# Patient Record
Sex: Female | Born: 1975 | Race: Black or African American | Hispanic: No | Marital: Single | Smoking: Current every day smoker
Health system: Southern US, Community
[De-identification: ages and names within clinical notes are randomized; demographics above are authoritative.]

## PROBLEM LIST (undated history)

## (undated) DIAGNOSIS — F419 Anxiety disorder, unspecified: Secondary | ICD-10-CM

## (undated) DIAGNOSIS — I10 Essential (primary) hypertension: Secondary | ICD-10-CM

## (undated) DIAGNOSIS — F32A Depression, unspecified: Secondary | ICD-10-CM

## (undated) DIAGNOSIS — K219 Gastro-esophageal reflux disease without esophagitis: Secondary | ICD-10-CM

## (undated) DIAGNOSIS — J45909 Unspecified asthma, uncomplicated: Secondary | ICD-10-CM

## (undated) DIAGNOSIS — F329 Major depressive disorder, single episode, unspecified: Secondary | ICD-10-CM

## (undated) HISTORY — PX: RECTAL POLYPECTOMY: SHX2309

## (undated) HISTORY — PX: HEMORRHOID SURGERY: SHX153

---

## 2002-01-10 ENCOUNTER — Other Ambulatory Visit: Admission: RE | Admit: 2002-01-10 | Discharge: 2002-01-10 | Payer: Self-pay | Admitting: Family Medicine

## 2003-04-17 ENCOUNTER — Other Ambulatory Visit: Admission: RE | Admit: 2003-04-17 | Discharge: 2003-04-17 | Payer: Self-pay | Admitting: Family Medicine

## 2004-03-16 ENCOUNTER — Emergency Department (HOSPITAL_COMMUNITY): Admission: EM | Admit: 2004-03-16 | Discharge: 2004-03-17 | Payer: Self-pay | Admitting: Emergency Medicine

## 2005-03-20 ENCOUNTER — Inpatient Hospital Stay (HOSPITAL_COMMUNITY): Admission: AD | Admit: 2005-03-20 | Discharge: 2005-03-20 | Payer: Self-pay | Admitting: Obstetrics

## 2005-05-09 ENCOUNTER — Inpatient Hospital Stay (HOSPITAL_COMMUNITY): Admission: AD | Admit: 2005-05-09 | Discharge: 2005-05-09 | Payer: Self-pay | Admitting: Obstetrics

## 2005-08-15 ENCOUNTER — Ambulatory Visit: Payer: Self-pay | Admitting: *Deleted

## 2005-08-15 ENCOUNTER — Inpatient Hospital Stay (HOSPITAL_COMMUNITY): Admission: AD | Admit: 2005-08-15 | Discharge: 2005-08-15 | Payer: Self-pay | Admitting: Obstetrics

## 2005-08-18 ENCOUNTER — Ambulatory Visit: Payer: Self-pay | Admitting: *Deleted

## 2005-08-21 ENCOUNTER — Inpatient Hospital Stay (HOSPITAL_COMMUNITY): Admission: AD | Admit: 2005-08-21 | Discharge: 2005-08-22 | Payer: Self-pay | Admitting: Obstetrics

## 2005-08-25 ENCOUNTER — Ambulatory Visit: Payer: Self-pay | Admitting: *Deleted

## 2005-08-29 ENCOUNTER — Ambulatory Visit (HOSPITAL_COMMUNITY): Admission: RE | Admit: 2005-08-29 | Discharge: 2005-08-29 | Payer: Self-pay | Admitting: Obstetrics

## 2005-08-29 ENCOUNTER — Ambulatory Visit: Payer: Self-pay | Admitting: *Deleted

## 2005-09-01 ENCOUNTER — Ambulatory Visit: Payer: Self-pay | Admitting: *Deleted

## 2005-09-02 ENCOUNTER — Encounter (INDEPENDENT_AMBULATORY_CARE_PROVIDER_SITE_OTHER): Payer: Self-pay | Admitting: Specialist

## 2005-09-02 ENCOUNTER — Inpatient Hospital Stay (HOSPITAL_COMMUNITY): Admission: AD | Admit: 2005-09-02 | Discharge: 2005-09-07 | Payer: Self-pay | Admitting: Obstetrics

## 2005-09-08 ENCOUNTER — Encounter: Admission: RE | Admit: 2005-09-08 | Discharge: 2005-10-05 | Payer: Self-pay | Admitting: Obstetrics

## 2005-09-09 ENCOUNTER — Observation Stay (HOSPITAL_COMMUNITY): Admission: AD | Admit: 2005-09-09 | Discharge: 2005-09-10 | Payer: Self-pay | Admitting: Obstetrics

## 2005-09-19 ENCOUNTER — Inpatient Hospital Stay (HOSPITAL_COMMUNITY): Admission: AD | Admit: 2005-09-19 | Discharge: 2005-09-22 | Payer: Self-pay | Admitting: Obstetrics

## 2005-10-06 ENCOUNTER — Encounter: Admission: RE | Admit: 2005-10-06 | Discharge: 2005-11-05 | Payer: Self-pay | Admitting: Obstetrics

## 2005-11-06 ENCOUNTER — Encounter: Admission: RE | Admit: 2005-11-06 | Discharge: 2005-12-05 | Payer: Self-pay | Admitting: Obstetrics

## 2005-12-06 ENCOUNTER — Encounter: Admission: RE | Admit: 2005-12-06 | Discharge: 2006-01-05 | Payer: Self-pay | Admitting: Obstetrics

## 2006-01-06 ENCOUNTER — Encounter: Admission: RE | Admit: 2006-01-06 | Discharge: 2006-02-04 | Payer: Self-pay | Admitting: Obstetrics

## 2006-02-05 ENCOUNTER — Encounter: Admission: RE | Admit: 2006-02-05 | Discharge: 2006-03-07 | Payer: Self-pay | Admitting: Obstetrics

## 2006-03-08 ENCOUNTER — Encounter: Admission: RE | Admit: 2006-03-08 | Discharge: 2006-04-07 | Payer: Self-pay | Admitting: Obstetrics

## 2006-04-08 ENCOUNTER — Encounter: Admission: RE | Admit: 2006-04-08 | Discharge: 2006-05-07 | Payer: Self-pay | Admitting: Obstetrics

## 2006-05-08 ENCOUNTER — Encounter: Admission: RE | Admit: 2006-05-08 | Discharge: 2006-06-07 | Payer: Self-pay | Admitting: Obstetrics

## 2006-06-08 ENCOUNTER — Encounter: Admission: RE | Admit: 2006-06-08 | Discharge: 2006-07-07 | Payer: Self-pay | Admitting: Obstetrics

## 2006-07-08 ENCOUNTER — Encounter: Admission: RE | Admit: 2006-07-08 | Discharge: 2006-08-07 | Payer: Self-pay | Admitting: Obstetrics

## 2006-08-08 ENCOUNTER — Encounter: Admission: RE | Admit: 2006-08-08 | Discharge: 2006-09-07 | Payer: Self-pay | Admitting: Obstetrics

## 2006-09-08 ENCOUNTER — Encounter: Admission: RE | Admit: 2006-09-08 | Discharge: 2006-10-06 | Payer: Self-pay | Admitting: Obstetrics

## 2006-10-07 ENCOUNTER — Encounter: Admission: RE | Admit: 2006-10-07 | Discharge: 2006-11-06 | Payer: Self-pay | Admitting: Obstetrics

## 2006-11-07 ENCOUNTER — Encounter: Admission: RE | Admit: 2006-11-07 | Discharge: 2006-12-06 | Payer: Self-pay | Admitting: Obstetrics

## 2006-12-07 ENCOUNTER — Encounter: Admission: RE | Admit: 2006-12-07 | Discharge: 2007-01-06 | Payer: Self-pay | Admitting: Obstetrics

## 2007-01-07 ENCOUNTER — Encounter: Admission: RE | Admit: 2007-01-07 | Discharge: 2007-02-05 | Payer: Self-pay | Admitting: Obstetrics

## 2007-02-06 ENCOUNTER — Encounter: Admission: RE | Admit: 2007-02-06 | Discharge: 2007-03-08 | Payer: Self-pay | Admitting: Obstetrics

## 2007-03-09 ENCOUNTER — Encounter: Admission: RE | Admit: 2007-03-09 | Discharge: 2007-04-08 | Payer: Self-pay | Admitting: Obstetrics

## 2010-08-07 ENCOUNTER — Encounter: Payer: Self-pay | Admitting: *Deleted

## 2010-12-02 NOTE — Op Note (Signed)
NAMEYULIYA, NOVA             ACCOUNT NO.:  [id number]   MEDICAL RECORD NO.:  [id number]          PATIENT TYPE:  INP   LOCATION:  9312                          FACILITY:  [facility]   PHYSICIAN:  Roseanna Rainbow, M.D.DATE OF BIRTH:  08-04-1975   DATE OF PROCEDURE:  09/20/2005  DATE OF DISCHARGE:                                 OPERATIVE REPORT   PREOPERATIVE DIAGNOSIS:  Incision abscess post cesarean delivery.   POSTOPERATIVE DIAGNOSIS:  Incision abscess post cesarean delivery.   PROCEDURE:  Incision and drainage with sharp debridement.   SURGEON:  Dr. Tamela Oddi   ANESTHESIA:  Spinal.   ESTIMATED BLOOD LOSS:  Minimal.   COMPLICATIONS:  None.   DESCRIPTION OF PROCEDURE:  The patient was taken to the operating room with  IV running. A spinal anesthetic was administered without difficulty. She was  placed in the dorsal supine position and prepped and draped in usual sterile  fashion. A transverse incision was made over the abscess. The skin was  incised with a scalpel down to the abscess cavity. Cultures were sent to the  subcutaneous tissue was then sharply debrided. The fascia was intact. The  wound was then packed with Kerlix. At the close of the procedure the  instrument and pack counts were said to be correct x2. The patient was taken  to the PACU awake and in stable condition.      Roseanna Rainbow, M.D.  Electronically Signed     LAJ/MEDQ  D:  09/20/2005  T:  09/21/2005  Job:  [id number]   cc:   Kathreen Cosier, M.D.  Fax: [phone]

## 2010-12-02 NOTE — Discharge Summary (Signed)
Faith Wells, Faith Wells             ACCOUNT NO.:  [id number]   MEDICAL RECORD NO.:  [id number]          PATIENT TYPE:  INP   LOCATION:  9312                          FACILITY:  [facility]   PHYSICIAN:  Roseanna Rainbow, M.D.DATE OF BIRTH:  [birth year]   DATE OF ADMISSION:  09/19/2005  DATE OF DISCHARGE:  09/22/2005                                 DISCHARGE SUMMARY   CHIEF COMPLAINT:  The patient is a 35 year old status post a cesarean  delivery on September 02, 2005, with increasing periumbilical pain and fever.   HISTORY OF PRESENT ILLNESS:  The patient had been undergoing wound care for  a superficial wound infection.  She presented to the [facility] emergency  department earlier today with the above complaints.  Recent wound cultures  have been negative.   ALLERGIES:  No known drug allergies.   MEDICATIONS:  Prenatal vitamins, labetalol, Norvasc, clindamycin, Tylenol  No. 3, birth control pills, Prozac.   PAST MEDICAL HISTORY:  Chronic hypertension, depression, asthma.   FAMILY HISTORY:  Noncontributory.   PHYSICAL EXAMINATION:  VITAL SIGNS:  T-max 100.6, vital signs stable,  afebrile at present.  GENERAL APPEARANCE:  No apparent distress.  ABDOMEN:  There is a subcutaneous mass several centimeters in diameter,  periumbilical, tender, indurated.  There was an attempt to aspirate the  mass; however, there was only small blood noted.  There is a very small  superficial wound separation which was packed with Steri-Strips.   LABORATORY WORK:  White count 13,400; hemoglobin 9.   ASSESSMENT:  Questionable periumbilical hematoma.   PLAN:  Admission, continue intravenous antibiotics.  Review an abdominal  ultrasound.   The patient was admitted.  An ultrasound was consistent with a likely  abscess.  On March 7 she was brought to the OR for incision and drainage.  Please see the dictated operative summary for further details.  Her white  blood cell count on postoperative day #2 was  7300.  Blood cultures and wound  cultures were all no growth.  The wound was felt to be granulating in well.  She was discharged to home.   DISCHARGE DIAGNOSIS:  Wound infection.   PROCEDURE:  Incision and drainage, debridement.   CONDITION:  Stable.   DIET:  Regular.   ACTIVITY:  Ad lib.   MEDICATIONS:  1.  Resume preoperative medications.  2.  Percocet one to two tablets every 6 hours as needed.  3.  Augmentin one tablet twice a day for 10 days.  4.  Ferrous sulfate.   A referral was made to [facility].   DISPOSITION:  The patient was to follow up with Dr. Gaynell Face.      Roseanna Rainbow, M.D.  Electronically Signed     LAJ/MEDQ  D:  10/24/2005  T:  10/24/2005  Job:  [id number]

## 2014-08-30 ENCOUNTER — Emergency Department (HOSPITAL_COMMUNITY): Payer: BLUE CROSS/BLUE SHIELD

## 2014-08-30 ENCOUNTER — Encounter (HOSPITAL_COMMUNITY): Payer: Self-pay | Admitting: Emergency Medicine

## 2014-08-30 ENCOUNTER — Emergency Department (HOSPITAL_COMMUNITY)
Admission: EM | Admit: 2014-08-30 | Discharge: 2014-08-30 | Disposition: A | Discharge status: Home / Self Care | Payer: BLUE CROSS/BLUE SHIELD | Attending: Emergency Medicine | Admitting: Emergency Medicine

## 2014-08-30 DIAGNOSIS — Z3202 Encounter for pregnancy test, result negative: Secondary | ICD-10-CM | POA: Insufficient documentation

## 2014-08-30 DIAGNOSIS — I10 Essential (primary) hypertension: Secondary | ICD-10-CM | POA: Diagnosis not present

## 2014-08-30 DIAGNOSIS — Z79899 Other long term (current) drug therapy: Secondary | ICD-10-CM | POA: Diagnosis not present

## 2014-08-30 DIAGNOSIS — R1011 Right upper quadrant pain: Secondary | ICD-10-CM

## 2014-08-30 DIAGNOSIS — K8050 Calculus of bile duct without cholangitis or cholecystitis without obstruction: Secondary | ICD-10-CM

## 2014-08-30 DIAGNOSIS — Z720 Tobacco use: Secondary | ICD-10-CM | POA: Insufficient documentation

## 2014-08-30 DIAGNOSIS — K8020 Calculus of gallbladder without cholecystitis without obstruction: Secondary | ICD-10-CM | POA: Insufficient documentation

## 2014-08-30 DIAGNOSIS — R109 Unspecified abdominal pain: Secondary | ICD-10-CM | POA: Diagnosis present

## 2014-08-30 HISTORY — DX: Essential (primary) hypertension: I10

## 2014-08-30 LAB — COMPREHENSIVE METABOLIC PANEL
ALBUMIN: 3.6 g/dL (ref 3.5–5.2)
ALK PHOS: 67 U/L (ref 39–117)
ALT: 15 U/L (ref 0–35)
AST: 18 U/L (ref 0–37)
Anion gap: 5 (ref 5–15)
BUN: 8 mg/dL (ref 6–23)
CHLORIDE: 104 mmol/L (ref 96–112)
CO2: 29 mmol/L (ref 19–32)
CREATININE: 0.83 mg/dL (ref 0.50–1.10)
Calcium: 10.2 mg/dL (ref 8.4–10.5)
GFR calc non Af Amer: 89 mL/min — ABNORMAL LOW (ref >90)
Glucose, Bld: 157 mg/dL — ABNORMAL HIGH (ref 70–99)
Potassium: 3.4 mmol/L — ABNORMAL LOW (ref 3.5–5.1)
SODIUM: 138 mmol/L (ref 135–145)
Total Bilirubin: 0.5 mg/dL (ref 0.3–1.2)
Total Protein: 6.5 g/dL (ref 6.0–8.3)

## 2014-08-30 LAB — URINALYSIS, ROUTINE W REFLEX MICROSCOPIC
BILIRUBIN URINE: NEGATIVE
GLUCOSE, UA: NEGATIVE mg/dL
HGB URINE DIPSTICK: NEGATIVE
KETONES UR: 15 mg/dL — AB
Leukocytes, UA: NEGATIVE
Nitrite: NEGATIVE
PH: 8 (ref 5.0–8.0)
Protein, ur: NEGATIVE mg/dL
SPECIFIC GRAVITY, URINE: 1.024 (ref 1.005–1.030)
UROBILINOGEN UA: 1 mg/dL (ref 0.0–1.0)

## 2014-08-30 LAB — CBC WITH DIFFERENTIAL/PLATELET
Basophils Absolute: 0 10*3/uL (ref 0.0–0.1)
Basophils Relative: 0 % (ref 0–1)
EOS ABS: 0.1 10*3/uL (ref 0.0–0.7)
EOS PCT: 2 % (ref 0–5)
HCT: 40 % (ref 36.0–46.0)
HEMOGLOBIN: 13.3 g/dL (ref 12.0–15.0)
LYMPHS ABS: 1.1 10*3/uL (ref 0.7–4.0)
Lymphocytes Relative: 18 % (ref 12–46)
MCH: 30.8 pg (ref 26.0–34.0)
MCHC: 33.3 g/dL (ref 30.0–36.0)
MCV: 92.6 fL (ref 78.0–100.0)
MONO ABS: 0.4 10*3/uL (ref 0.1–1.0)
MONOS PCT: 6 % (ref 3–12)
Neutro Abs: 4.5 10*3/uL (ref 1.7–7.7)
Neutrophils Relative %: 74 % (ref 43–77)
Platelets: 245 10*3/uL (ref 150–400)
RBC: 4.32 MIL/uL (ref 3.87–5.11)
RDW: 12.9 % (ref 11.5–15.5)
WBC: 6.1 10*3/uL (ref 4.0–10.5)

## 2014-08-30 LAB — URINE MICROSCOPIC-ADD ON

## 2014-08-30 LAB — POC URINE PREG, ED: PREG TEST UR: NEGATIVE

## 2014-08-30 LAB — LIPASE, BLOOD: Lipase: 36 U/L (ref 11–59)

## 2014-08-30 MED ORDER — OXYCODONE-ACETAMINOPHEN 5-325 MG PO TABS: 1.0000 | ORAL_TABLET | ORAL | Status: DC | PRN

## 2014-08-30 MED ORDER — MORPHINE SULFATE 4 MG/ML IJ SOLN
4.0000 mg | Freq: Once | INTRAMUSCULAR | Status: AC
  Administered 2014-08-30: 4 mg via INTRAVENOUS
  Filled 2014-08-30: qty 1

## 2014-08-30 MED ORDER — KETOROLAC TROMETHAMINE 30 MG/ML IJ SOLN
30.0000 mg | Freq: Once | INTRAMUSCULAR | Status: AC
  Administered 2014-08-30: 30 mg via INTRAVENOUS
  Filled 2014-08-30: qty 1

## 2014-08-30 MED ORDER — SODIUM CHLORIDE 0.9 % IV BOLUS (SEPSIS)
1000.0000 mL | Freq: Once | INTRAVENOUS | Status: AC
  Administered 2014-08-30: 1000 mL via INTRAVENOUS

## 2014-08-30 MED ORDER — ONDANSETRON 4 MG PO TBDP: ORAL_TABLET | ORAL | Status: DC

## 2014-08-30 NOTE — ED Notes (Signed)
Pt. reports lowe abdominal and low back pain onset last Saturday , denies nausea or vomitting / no fever or diarrhea.

## 2014-08-30 NOTE — ED Notes (Signed)
Pt POCT Preg Neg. (-)  

## 2014-08-30 NOTE — ED Notes (Signed)
Pt made aware to return if symptoms worsen or if any life threatening symptoms occur.   

## 2014-08-30 NOTE — ED Notes (Signed)
Pt reports left upper abdominal pain since today at 10am.  Pt also reports lower back pain.  Pt denies n/v/d.  Pt alert and oriented.

## 2014-08-30 NOTE — Discharge Instructions (Signed)
Cholelithiasis °Cholelithiasis (also called gallstones) is a form of gallbladder disease in which gallstones form in your gallbladder. The gallbladder is an organ that stores bile made in the liver, which helps digest fats. Gallstones begin as small crystals and slowly grow into stones. Gallstone pain occurs when the gallbladder spasms and a gallstone is blocking the duct. Pain can also occur when a stone passes out of the duct.  °RISK FACTORS °· Being female.   °· Having multiple pregnancies. Health care providers sometimes advise removing diseased gallbladders before future pregnancies.   °· Being obese. °· Eating a diet heavy in fried foods and fat.   °· Being older than 60 years and increasing age.   °· Prolonged use of medicines containing female hormones.   °· Having diabetes mellitus.   °· Rapidly losing weight.   °· Having a family history of gallstones (heredity).   °SYMPTOMS °· Nausea.   °· Vomiting. °· Abdominal pain.   °· Yellowing of the skin (jaundice).   °· Sudden pain. It may persist from several minutes to several hours. °· Fever.   °· Tenderness to the touch.  °In some cases, when gallstones do not move into the bile duct, people have no pain or symptoms. These are called "silent" gallstones.  °TREATMENT °Silent gallstones do not need treatment. In severe cases, emergency surgery may be required. Options for treatment include: °· Surgery to remove the gallbladder. This is the most common treatment. °· Medicines. These do not always work and may take 6-12 months or more to work. °· Shock wave treatment (extracorporeal biliary lithotripsy). In this treatment an ultrasound machine sends shock waves to the gallbladder to break gallstones into smaller pieces that can pass into the intestines or be dissolved by medicine. °HOME CARE INSTRUCTIONS  °· Only take over-the-counter or prescription medicines for pain, discomfort, or fever as directed by your health care provider.   °· Follow a low-fat diet until  seen again by your health care provider. Fat causes the gallbladder to contract, which can result in pain.   °· Follow up with your health care provider as directed. Attacks are almost always recurrent and surgery is usually required for permanent treatment.   °SEEK IMMEDIATE MEDICAL CARE IF:  °· Your pain increases and is not controlled by medicines.   °· You have a fever or persistent symptoms for more than 2-3 days.   °· You have a fever and your symptoms suddenly get worse.   °· You have persistent nausea and vomiting.   °MAKE SURE YOU:  °· Understand these instructions. °· Will watch your condition. °· Will get help right away if you are not doing well or get worse. °Document Released: 06/29/2005 Document Revised: 03/05/2013 Document Reviewed: 12/25/2012 °Exit[employer]® Patient Information ©2015 [employer]. This information is not intended to replace advice given to you by your health care provider. Make sure you discuss any questions you have with your health care provider. ° °

## 2014-08-30 NOTE — ED Notes (Signed)
Pt taken to US

## 2014-08-30 NOTE — ED Provider Notes (Signed)
CSN: [id number]     Arrival date & time 08/30/14  0014 History  This chart was scribed for Loren Racer, MD by Abel Presto, ED Scribe. This patient was seen in room B18C/B18C and the patient's care was started at 3:54 AM.    Chief Complaint  Patient presents with  . Abdominal Pain  . Back Pain     Patient is a 39 y.o. female presenting with abdominal pain and back pain. The history is provided by the patient. No language interpreter was used.  Abdominal Pain Associated symptoms: nausea   Associated symptoms: no chest pain, no chills, no constipation, no diarrhea, no dysuria, no fever, no hematuria, no shortness of breath, no vaginal bleeding, no vaginal discharge and no vomiting   Back Pain Associated symptoms: abdominal pain   Associated symptoms: no chest pain, no dysuria, no fever, no headaches, no numbness and no weakness    HPI Comments: Faith Wells is a 39 y.o. female with PMHx of HTN who presents to the Emergency Department complaining of waxing and waning right sided abdominal pain radiating to lower back pain with onset at 9 PM. Pt notes associated nausea. Pt ate pizza and a cookie at 8 PM. Pt notes similar pain intermittently for several months. She states she has been treated for back pain with associated abdominal pain in the past. Pt notes last normal bowel movement was yesterday.  Pt has had a C-section but no other abdominal surgery. Pt took a muscle relaxer and Bayer ASA for relief PTA. Pt denies fever, diarrhea, vomiting, difficulty urinating, and dysuria.   Past Medical History  Diagnosis Date  . Hypertension    Past Surgical History  Procedure Laterality Date  . Cesarean section     No family history on file. History  Substance Use Topics  . Smoking status: Current Every Day Smoker  . Smokeless tobacco: Not on file  . Alcohol Use: Yes   OB History    No data available     Review of Systems  Constitutional: Negative for fever and chills.   Respiratory: Negative for shortness of breath.   Cardiovascular: Negative for chest pain.  Gastrointestinal: Positive for nausea and abdominal pain. Negative for vomiting, diarrhea and constipation.  Genitourinary: Negative for dysuria, frequency, hematuria, flank pain, vaginal bleeding, vaginal discharge, difficulty urinating and vaginal pain.  Musculoskeletal: Positive for back pain. Negative for myalgias, neck pain and neck stiffness.  Skin: Negative for rash and wound.  Neurological: Negative for dizziness, weakness, light-headedness, numbness and headaches.  All other systems reviewed and are negative.     Allergies  Review of patient's allergies indicates no known allergies.  Home Medications   Prior to Admission medications   Medication Sig Start Date End Date Taking? Authorizing Provider  amphetamine-dextroamphetamine (ADDERALL XR) 20 MG 24 hr capsule Take 20 mg by mouth every evening.  08/16/14  Yes Historical Provider, MD  amphetamine-dextroamphetamine (ADDERALL XR) 25 MG 24 hr capsule Take 25 mg by mouth daily.  08/16/14  Yes Historical Provider, MD  b complex vitamins tablet Take 1 tablet by mouth daily.   Yes Historical Provider, MD  Multiple Vitamin (MULTIVITAMIN WITH MINERALS) TABS tablet Take 1 tablet by mouth daily.   Yes Historical Provider, MD  valsartan-hydrochlorothiazide (DIOVAN-HCT) 160-12.5 MG per tablet Take 1 tablet by mouth daily.  06/10/14  Yes Historical Provider, MD  ondansetron (ZOFRAN ODT) 4 MG disintegrating tablet  ODT q4 hours prn nausea/vomit 08/30/14   Loren Racer, MD  oxyCODONE-acetaminophen [facility])  5-325 MG per tablet Take 1-2 tablets by mouth every 4 (four) hours as needed for severe pain. 08/30/14   Loren Racer, MD   BP 128/74 mmHg  Pulse 79  Temp(Src) 97.5 F (36.4 C) (Oral)  Resp 18  Ht 5' 9.5" (1.765 m)  Wt 195 lb (88.451 kg)  BMI 28.39 kg/m2  SpO2 100%  LMP 08/16/2014 Physical Exam  Constitutional: She is oriented to  person, place, and time. She appears well-developed and well-nourished. No distress.  HENT:  Head: Normocephalic and atraumatic.  Mouth/Throat: Oropharynx is clear and moist. No oropharyngeal exudate.  Eyes: Conjunctivae and EOM are normal. Pupils are equal, round, and reactive to light.  Neck: Normal range of motion. Neck supple.  Cardiovascular: Normal rate and regular rhythm.   Pulmonary/Chest: Effort normal and breath sounds normal. No respiratory distress. She has no wheezes. She has no rales.  Abdominal: Soft. Bowel sounds are normal. She exhibits no distension and no mass. There is tenderness (tenderness to palpation in the right upper quadrant.). There is no rebound and no guarding.  Musculoskeletal: Normal range of motion. She exhibits no edema or tenderness.  No CVA tenderness bilaterally. Back pain is not Reproduced with palpation. No midline thoracic or lumbar tenderness.  Neurological: She is alert and oriented to person, place, and time.  Skin: Skin is warm and dry. No rash noted. No erythema.  Psychiatric: She has a normal mood and affect. Her behavior is normal.  Nursing note and vitals reviewed.   ED Course  Procedures (including critical care time) DIAGNOSTIC STUDIES: Oxygen Saturation is 100% on room air, normal by my interpretation.    COORDINATION OF CARE: 3:59 AM Discussed treatment plan with patient at beside, the patient agrees with the plan and has no further questions at this time.   Labs Review Labs Reviewed  COMPREHENSIVE METABOLIC PANEL - Abnormal; Notable for the following:    Potassium 3.4 (*)    Glucose, Bld 157 (*)    GFR calc non Af Amer 89 (*)    All other components within normal limits  URINALYSIS, ROUTINE W REFLEX MICROSCOPIC - Abnormal; Notable for the following:    APPearance TURBID (*)    Ketones, ur 15 (*)    All other components within normal limits  URINE MICROSCOPIC-ADD ON - Abnormal; Notable for the following:    Squamous Epithelial /  LPF FEW (*)    Bacteria, UA FEW (*)    All other components within normal limits  CBC WITH DIFFERENTIAL/PLATELET  LIPASE, BLOOD  POC URINE PREG, ED   Results for orders placed or performed during the hospital encounter of 08/30/14  CBC with Differential  Result Value Ref Range   WBC 6.1 4.0 - 10.5 K/uL   RBC 4.32 3.87 - 5.11 MIL/uL   Hemoglobin 13.3 12.0 - 15.0 g/dL   HCT 40.9 81.1 - 91.4 %   MCV 92.6 78.0 - 100.0 fL   MCH 30.8 26.0 - 34.0 pg   MCHC 33.3 30.0 - 36.0 g/dL   RDW 78.2 95.6 - 21.3 %   Platelets 245 150 - 400 K/uL   Neutrophils Relative % 74 43 - 77 %   Neutro Abs 4.5 1.7 - 7.7 K/uL   Lymphocytes Relative 18 12 - 46 %   Lymphs Abs 1.1 0.7 - 4.0 K/uL   Monocytes Relative 6 3 - 12 %   Monocytes Absolute 0.4 0.1 - 1.0 K/uL   Eosinophils Relative 2 0 - 5 %   Eosinophils  Absolute 0.1 0.0 - 0.7 K/uL   Basophils Relative 0 0 - 1 %   Basophils Absolute 0.0 0.0 - 0.1 K/uL  Comprehensive metabolic panel  Result Value Ref Range   Sodium 138 135 - 145 mmol/L   Potassium 3.4 (L) 3.5 - 5.1 mmol/L   Chloride 104 96 - 112 mmol/L   CO2 29 19 - 32 mmol/L   Glucose, Bld 157 (H) 70 - 99 mg/dL   BUN 8 6 - 23 mg/dL   Creatinine, Ser 1.61 0.50 - 1.10 mg/dL   Calcium 09.6 8.4 - 04.5 mg/dL   Total Protein 6.5 6.0 - 8.3 g/dL   Albumin 3.6 3.5 - 5.2 g/dL   AST 18 0 - 37 U/L   ALT 15 0 - 35 U/L   Alkaline Phosphatase 67 39 - 117 U/L   Total Bilirubin 0.5 0.3 - 1.2 mg/dL   GFR calc non Af Amer 89 (L) >90 mL/min   GFR calc Af Amer >90 >90 mL/min   Anion gap 5 5 - 15  Urinalysis, Routine w reflex microscopic  Result Value Ref Range   Color, Urine YELLOW YELLOW   APPearance TURBID (A) CLEAR   Specific Gravity, Urine 1.024 1.005 - 1.030   pH 8.0 5.0 - 8.0   Glucose, UA NEGATIVE NEGATIVE mg/dL   Hgb urine dipstick NEGATIVE NEGATIVE   Bilirubin Urine NEGATIVE NEGATIVE   Ketones, ur 15 (A) NEGATIVE mg/dL   Protein, ur NEGATIVE NEGATIVE mg/dL   Urobilinogen, UA 1.0 0.0 - 1.0 mg/dL    Nitrite NEGATIVE NEGATIVE   Leukocytes, UA NEGATIVE NEGATIVE  Lipase, blood  Result Value Ref Range   Lipase 36 11 - 59 U/L  Urine microscopic-add on  Result Value Ref Range   Squamous Epithelial / LPF FEW (A) RARE   Bacteria, UA FEW (A) RARE   Urine-Other MANY YEAST   POC Urine Pregnancy, ED  (If Pre-menopausal female) - do not order at [facility]  Result Value Ref Range   Preg Test, Ur NEGATIVE NEGATIVE   US Abdomen Complete  08/30/2014   CLINICAL DATA:  Acute onset of right upper quadrant abdominal pain. Initial encounter.  EXAM: ULTRASOUND ABDOMEN COMPLETE  COMPARISON:  None.  FINDINGS: Gallbladder: Scattered stones are seen dependently within the gallbladder. The gallbladder wall is borderline prominent, but the gallbladder is otherwise unremarkable. No pericholecystic fluid is seen. No ultrasonographic Murphy's sign is elicited.  Common bile duct: Diameter: 0.4 cm, within normal limits in caliber.  Liver: No focal lesion identified. Within normal limits in parenchymal echogenicity.  IVC: No abnormality visualized.  Pancreas: Visualized portion unremarkable.  Spleen: Size and appearance within normal limits.  Right Kidney: Length: 10.5 cm. Echogenicity within normal limits. No mass or hydronephrosis visualized.  Left Kidney: Length: 10.0 cm. Echogenicity within normal limits. No mass or hydronephrosis visualized.  Abdominal aorta: No aneurysm visualized.  Other findings: None.  IMPRESSION: 1. Cholelithiasis; borderline prominence of the gallbladder wall. No definite evidence for obstruction or cholecystitis. Gallbladder otherwise unremarkable. 2. Otherwise unremarkable abdominal ultrasound.   Electronically Signed   By: Roanna Raider M.D.   On: 08/30/2014 05:14     Imaging Review US Abdomen Complete  08/30/2014   CLINICAL DATA:  Acute onset of right upper quadrant abdominal pain. Initial encounter.  EXAM: ULTRASOUND ABDOMEN COMPLETE  COMPARISON:  None.  FINDINGS: Gallbladder: Scattered stones  are seen dependently within the gallbladder. The gallbladder wall is borderline prominent, but the gallbladder is otherwise unremarkable. No pericholecystic fluid is  seen. No ultrasonographic Murphy's sign is elicited.  Common bile duct: Diameter: 0.4 cm, within normal limits in caliber.  Liver: No focal lesion identified. Within normal limits in parenchymal echogenicity.  IVC: No abnormality visualized.  Pancreas: Visualized portion unremarkable.  Spleen: Size and appearance within normal limits.  Right Kidney: Length: 10.5 cm. Echogenicity within normal limits. No mass or hydronephrosis visualized.  Left Kidney: Length: 10.0 cm. Echogenicity within normal limits. No mass or hydronephrosis visualized.  Abdominal aorta: No aneurysm visualized.  Other findings: None.  IMPRESSION: 1. Cholelithiasis; borderline prominence of the gallbladder wall. No definite evidence for obstruction or cholecystitis. Gallbladder otherwise unremarkable. 2. Otherwise unremarkable abdominal ultrasound.   Electronically Signed   By: Roanna RaiderJeffery  Chang M.D.   On: 08/30/2014 05:14     EKG Interpretation None      MDM   Final diagnoses:  RUQ pain  Biliary colic   I personally performed the services described in this documentation, which was scribed in my presence. The recorded information has been reviewed and is accurate.   patient's pain is completely resolved with medication. She is resting comfortably.  We'll discharge home with general surgery follow-up. She's been advised to avoid all greasy and fatty foods. Return precautions have been given.    Loren Raceravid [place], MD 08/30/14 [phone]

## 2014-09-07 ENCOUNTER — Other Ambulatory Visit (INDEPENDENT_AMBULATORY_CARE_PROVIDER_SITE_OTHER): Payer: Self-pay | Admitting: General Surgery

## 2014-09-07 NOTE — H&P (Signed)
Faith Wells 09/07/2014 8:58 AM Location: [facility] Patient #: [id number] DOB: 10/04/1975 [name] / Race: Black or African American Female  History of Present Illness Almond Lint(Ryland Tungate MD; 09/07/2014 9:39 AM) Patient words: gallbladder.  The patient is a 39 year old female who presents for evaluation of gall stones. Patient is referred by Dr. Loren Raceravid Yelverton from the emergency department for consultation regarding her gallstones. Patient is a 39 year old female who presents with complaints of epigastric and right upper quadrant pain. [facility] She has noticed that she has an attack of pain every time she eats greasy foods, but they always go away on their own within a few hours. She had a severe attack on Valentine's Day after eating pizza and cookies. The pain was so severe that she had to go to the emergency department again. She was found to have borderline thickness of the gallbladder wall and numerous gallstones. Since that time, she has remained fairly sore. The severe pain has resolved. She did have nausea but no vomiting. She also had a temperature of 100.7 on Valentine's Day.   Other Problems Fay Records(Ashley Beck, CMA; 09/07/2014 8:58 AM) Gastroesophageal Reflux Disease  Past Surgical History Fay Records(Ashley Beck, CMA; 09/07/2014 8:58 AM) Cesarean Section - 1 Colon Polyp Removal - Open  Diagnostic Studies History Fay Records(Ashley Beck, CMA; 09/07/2014 8:58 AM) Mammogram never  Allergies Fay Records(Ashley Beck, CMA; 09/07/2014 8:59 AM) No Known Drug Allergies02/22/2016  Medication History Fay Records(Ashley Beck, CMA; 09/07/2014 9:00 AM) Oxycodone-Acetaminophen (5-325MG  Tablet, Oral) Active. Valsartan-Hydrochlorothiazide (160-12.5MG  Tablet, Oral) Active. Ondansetron (4MG  Tablet Disperse, Oral) Active. Amphetamine-Dextroamphet ER (25MG  Capsule ER 24HR, Oral) Active. Amphetamine-Dextroamphet ER (20MG  Capsule ER 24HR, Oral)  Active. Amphetamine-Dextroamphet ER (15MG  Capsule ER 24HR, Oral) Active.  Social History Fay Records(Ashley Beck, [place]; 09/07/2014 8:58 AM) Alcohol use Occasional alcohol use. Caffeine use Coffee. No drug use Tobacco use Current every day smoker.  Family History Fay Records(Ashley Beck, [place]; 09/07/2014 8:58 AM) Hypertension Father, Mother.  Pregnancy / Birth History Fay Records(Ashley Beck, CMA; 09/07/2014 8:58 AM) Age at menarche 14 years. Contraceptive History Contraceptive implant. Gravida 2  Review of Systems Fay Records(Ashley Beck CMA; 09/07/2014 8:58 AM) General Present- Appetite Loss. Not Present- Chills, Fatigue, Fever, Night Sweats, Weight Gain and Weight Loss. Skin Not Present- Change in Wart/Mole, Dryness, Hives, Jaundice, New Lesions, Non-Healing Wounds, Rash and Ulcer. HEENT Not Present- Earache, Hearing Loss, Hoarseness, Nose Bleed, Oral Ulcers, Ringing in the Ears, Seasonal Allergies, Sinus Pain, Sore Throat, Visual Disturbances, Wears glasses/contact lenses and Yellow Eyes. Respiratory Not Present- Bloody sputum, Chronic Cough, Difficulty Breathing, Snoring and Wheezing. Breast Not Present- Breast Mass, Breast Pain, Nipple Discharge and Skin Changes. Cardiovascular Not Present- Chest Pain, Difficulty Breathing Lying Down, Leg Cramps, Palpitations, Rapid Heart Rate, Shortness of Breath and Swelling of Extremities. Gastrointestinal Present- Bloating. Not Present- Abdominal Pain, Bloody Stool, Change in Bowel Habits, Chronic diarrhea, Constipation, Difficulty Swallowing, Excessive gas, Gets full quickly at meals, Hemorrhoids, Indigestion, Nausea, Rectal Pain and Vomiting. Female Genitourinary Not Present- Frequency, Nocturia, Painful Urination, Pelvic Pain and Urgency. Musculoskeletal Not Present- Back Pain, Joint Pain, Joint Stiffness, Muscle Pain, Muscle Weakness and Swelling of Extremities. Neurological Not Present- Decreased Memory, Fainting, Headaches, Numbness, Seizures, Tingling, Tremor, Trouble walking and  Weakness. Psychiatric Not Present- Anxiety, Bipolar, Change in Sleep Pattern, Depression, Fearful and Frequent crying. Endocrine Not Present- Cold Intolerance, Excessive Hunger, Hair Changes, Heat Intolerance, Hot flashes and New Diabetes. Hematology Not Present- Easy Bruising, Excessive bleeding, Gland problems, HIV and Persistent  Infections.   Vitals Fay Records CMA; 09/07/2014 9:00 AM) 09/07/2014 9:00 AM Weight: 192 lb Height: 69in Body Surface Area: 2.06 m Body Mass Index: 28.35 kg/m Temp.: 54F(Temporal)  Pulse: 66 (Regular)  BP: 122/68 (Sitting, Left Arm, Standard)    Physical Exam Almond Lint MD; 09/07/2014 9:39 AM) General Mental Status-Alert. General Appearance-Consistent with stated age. Hydration-Well hydrated. Voice-Normal.  Head and Neck Head-normocephalic, atraumatic with no lesions or palpable masses. Trachea-midline. Thyroid Gland Characteristics - normal size and consistency.  Eye Eyeball - Bilateral-Extraocular movements intact. Sclera/Conjunctiva - Bilateral-No scleral icterus.  Chest and Lung Exam Chest and lung exam reveals -quiet, even and easy respiratory effort with no use of accessory muscles and on auscultation, normal breath sounds, no adventitious sounds and normal vocal resonance. Inspection Chest Wall - Normal. Back - normal.  Cardiovascular Cardiovascular examination reveals -normal heart sounds, regular rate and rhythm with no murmurs and normal pedal pulses bilaterally.  Abdomen Inspection Inspection of the abdomen reveals - No Hernias. Palpation/Percussion Palpation and Percussion of the abdomen reveal - Soft, No Rebound tenderness, No Rigidity (guarding) and No hepatosplenomegaly. Tenderness - Epigastrium and Right Upper Quadrant. Auscultation Auscultation of the abdomen reveals - Bowel sounds normal.  Neurologic Neurologic evaluation reveals -alert and oriented x 3 with no impairment of recent or  remote memory. Mental Status-Normal.  Musculoskeletal Global Assessment -Note: no gross deformities.  Normal Exam - Left-Upper Extremity Strength Normal and Lower Extremity Strength Normal. Normal Exam - Right-Upper Extremity Strength Normal and Lower Extremity Strength Normal.  Lymphatic Head & Neck  General Head & Neck Lymphatics: Bilateral - Description - Normal. Axillary  General Axillary Region: Bilateral - Description - Normal. Tenderness - Non Tender. Femoral & Inguinal  Generalized Femoral & Inguinal Lymphatics: Bilateral - Description - No Generalized lymphadenopathy.    Assessment & Plan Almond Lint MD; 09/07/2014 9:41 AM) CHRONIC CHOLECYSTITIS WITH CALCULUS (574.10  K80.10) Impression: The patient does have evidence for chronic cholecystitis with gallstones. She does remain tender in the right upper quadrant and epigastrium. I do think she will need to go ahead and have a laparoscopic cholecystectomy. I reviewed the surgery with the patient as well as time off work. I reviewed the recovery period and restrictions.  The surgical procedure was described to the patient in detail. The patient was given educational material. I discussed the incision type and location, the location of the gallbladder, the anatomy of the bile ducts and arteries, and the typical progression of surgery. I discussed the possibility of converting to an open operation. I advised of the risks of bleeding, infection, damage to other structures (such as the bile duct, intestine or liver), bile leak, need for other procedures or surgeries, and post op diarrhea/constipation. We discussed the risk of blood clot. We discussed the recovery period and post operative restrictions. The patient was advised against taking blood thinners the week before surgery. Current Plans  Pt Education - Laparoscopic Cholecystectomy: gallbladder Schedule for Surgery   Signed by Almond Lint, MD (09/07/2014 9:42 AM)

## 2014-10-08 ENCOUNTER — Encounter (HOSPITAL_COMMUNITY): Payer: Self-pay | Admitting: Pharmacy Technician

## 2014-10-09 ENCOUNTER — Other Ambulatory Visit: Payer: Self-pay

## 2014-10-09 ENCOUNTER — Encounter (HOSPITAL_COMMUNITY): Payer: Self-pay

## 2014-10-09 ENCOUNTER — Encounter (HOSPITAL_COMMUNITY)
Admission: RE | Admit: 2014-10-09 | Discharge: 2014-10-09 | Disposition: A | Discharge status: Home / Self Care | Payer: BLUE CROSS/BLUE SHIELD | Source: Ambulatory Visit | Attending: General Surgery | Admitting: General Surgery

## 2014-10-09 DIAGNOSIS — Z01810 Encounter for preprocedural cardiovascular examination: Secondary | ICD-10-CM | POA: Diagnosis not present

## 2014-10-09 DIAGNOSIS — Z01812 Encounter for preprocedural laboratory examination: Secondary | ICD-10-CM | POA: Insufficient documentation

## 2014-10-09 HISTORY — DX: Unspecified asthma, uncomplicated: J45.909

## 2014-10-09 HISTORY — DX: Depression, unspecified: F32.A

## 2014-10-09 HISTORY — DX: Major depressive disorder, single episode, unspecified: F32.9

## 2014-10-09 HISTORY — DX: Anxiety disorder, unspecified: F41.9

## 2014-10-09 HISTORY — DX: Gastro-esophageal reflux disease without esophagitis: K21.9

## 2014-10-09 LAB — URINALYSIS, ROUTINE W REFLEX MICROSCOPIC
Bilirubin Urine: NEGATIVE
GLUCOSE, UA: NEGATIVE mg/dL
HGB URINE DIPSTICK: NEGATIVE
KETONES UR: NEGATIVE mg/dL
Leukocytes, UA: NEGATIVE
Nitrite: NEGATIVE
PROTEIN: NEGATIVE mg/dL
Specific Gravity, Urine: 1.019 (ref 1.005–1.030)
Urobilinogen, UA: 0.2 mg/dL (ref 0.0–1.0)
pH: 6 (ref 5.0–8.0)

## 2014-10-09 LAB — COMPREHENSIVE METABOLIC PANEL
ALT: 20 U/L (ref 0–35)
AST: 21 U/L (ref 0–37)
Albumin: 3.7 g/dL (ref 3.5–5.2)
Alkaline Phosphatase: 78 U/L (ref 39–117)
Anion gap: 7 (ref 5–15)
BUN: 6 mg/dL (ref 6–23)
CALCIUM: 10 mg/dL (ref 8.4–10.5)
CO2: 30 mmol/L (ref 19–32)
Chloride: 103 mmol/L (ref 96–112)
Creatinine, Ser: 0.91 mg/dL (ref 0.50–1.10)
GFR calc non Af Amer: 80 mL/min — ABNORMAL LOW (ref >90)
GLUCOSE: 57 mg/dL — AB (ref 70–99)
Potassium: 3.7 mmol/L (ref 3.5–5.1)
SODIUM: 140 mmol/L (ref 135–145)
TOTAL PROTEIN: 6.6 g/dL (ref 6.0–8.3)
Total Bilirubin: 0.8 mg/dL (ref 0.3–1.2)

## 2014-10-09 LAB — CBC WITH DIFFERENTIAL/PLATELET
BASOS ABS: 0 10*3/uL (ref 0.0–0.1)
BASOS PCT: 0 % (ref 0–1)
EOS PCT: 1 % (ref 0–5)
Eosinophils Absolute: 0.1 10*3/uL (ref 0.0–0.7)
HEMATOCRIT: 41.1 % (ref 36.0–46.0)
Hemoglobin: 13.6 g/dL (ref 12.0–15.0)
LYMPHS ABS: 1.5 10*3/uL (ref 0.7–4.0)
Lymphocytes Relative: 19 % (ref 12–46)
MCH: 30.8 pg (ref 26.0–34.0)
MCHC: 33.1 g/dL (ref 30.0–36.0)
MCV: 93 fL (ref 78.0–100.0)
MONOS PCT: 9 % (ref 3–12)
Monocytes Absolute: 0.7 10*3/uL (ref 0.1–1.0)
Neutro Abs: 5.4 10*3/uL (ref 1.7–7.7)
Neutrophils Relative %: 71 % (ref 43–77)
Platelets: 242 10*3/uL (ref 150–400)
RBC: 4.42 MIL/uL (ref 3.87–5.11)
RDW: 13.8 % (ref 11.5–15.5)
WBC: 7.7 10*3/uL (ref 4.0–10.5)

## 2014-10-09 LAB — HCG, SERUM, QUALITATIVE: Preg, Serum: NEGATIVE

## 2014-10-12 NOTE — Progress Notes (Signed)
Anesthesia Chart Review:  Pt is [age] year old female scheduled for laparoscopic cholecystectomy with possible intraoperative cholangiogram on 10/22/2014 with Dr. Donell BeersByerly.   PMH includes: HTN, asthma, GERD. Current smoker. BMI 28.   Preoperative labs reviewed.  Glucose 57. No hx DM. Will check CBG DOS.   EKG: NSR.   If no changes, I anticipate pt can proceed with surgery as scheduled.   Rica Mastngela Celia Gibbons, FNP-BC [facility]Anesthesiology Phone: [phone] 10/12/2014 2:37 PM

## 2014-10-21 MED ORDER — CEFAZOLIN SODIUM-DEXTROSE 2-3 GM-% IV SOLR
2.0000 g | INTRAVENOUS | Status: AC
  Administered 2014-10-22: 2 g via INTRAVENOUS
  Filled 2014-10-21: qty 50

## 2014-10-22 ENCOUNTER — Ambulatory Visit (HOSPITAL_COMMUNITY): Payer: BLUE CROSS/BLUE SHIELD

## 2014-10-22 ENCOUNTER — Encounter (HOSPITAL_COMMUNITY): Payer: Self-pay | Admitting: *Deleted

## 2014-10-22 ENCOUNTER — Ambulatory Visit (HOSPITAL_COMMUNITY): Payer: BLUE CROSS/BLUE SHIELD | Admitting: Anesthesiology

## 2014-10-22 ENCOUNTER — Encounter (HOSPITAL_COMMUNITY)
Admission: RE | Disposition: A | Discharge status: Home / Self Care | Payer: Self-pay | Source: Ambulatory Visit | Attending: General Surgery

## 2014-10-22 ENCOUNTER — Ambulatory Visit (HOSPITAL_COMMUNITY): Payer: BLUE CROSS/BLUE SHIELD | Admitting: Emergency Medicine

## 2014-10-22 ENCOUNTER — Ambulatory Visit (HOSPITAL_COMMUNITY)
Admission: RE | Admit: 2014-10-22 | Discharge: 2014-10-22 | Disposition: A | Discharge status: Home / Self Care | Payer: BLUE CROSS/BLUE SHIELD | Source: Ambulatory Visit | Attending: General Surgery | Admitting: General Surgery

## 2014-10-22 DIAGNOSIS — J45909 Unspecified asthma, uncomplicated: Secondary | ICD-10-CM | POA: Diagnosis not present

## 2014-10-22 DIAGNOSIS — I10 Essential (primary) hypertension: Secondary | ICD-10-CM | POA: Diagnosis not present

## 2014-10-22 DIAGNOSIS — K819 Cholecystitis, unspecified: Secondary | ICD-10-CM | POA: Diagnosis present

## 2014-10-22 DIAGNOSIS — F419 Anxiety disorder, unspecified: Secondary | ICD-10-CM | POA: Diagnosis not present

## 2014-10-22 DIAGNOSIS — F17210 Nicotine dependence, cigarettes, uncomplicated: Secondary | ICD-10-CM | POA: Insufficient documentation

## 2014-10-22 DIAGNOSIS — K8010 Calculus of gallbladder with chronic cholecystitis without obstruction: Secondary | ICD-10-CM | POA: Insufficient documentation

## 2014-10-22 DIAGNOSIS — Z419 Encounter for procedure for purposes other than remedying health state, unspecified: Secondary | ICD-10-CM

## 2014-10-22 DIAGNOSIS — K219 Gastro-esophageal reflux disease without esophagitis: Secondary | ICD-10-CM | POA: Diagnosis not present

## 2014-10-22 DIAGNOSIS — Z86010 Personal history of colonic polyps: Secondary | ICD-10-CM | POA: Insufficient documentation

## 2014-10-22 DIAGNOSIS — F329 Major depressive disorder, single episode, unspecified: Secondary | ICD-10-CM | POA: Insufficient documentation

## 2014-10-22 HISTORY — PX: CHOLECYSTECTOMY: SHX55

## 2014-10-22 LAB — GLUCOSE, CAPILLARY: Glucose-Capillary: 83 mg/dL (ref 70–99)

## 2014-10-22 SURGERY — LAPAROSCOPIC CHOLECYSTECTOMY WITH INTRAOPERATIVE CHOLANGIOGRAM
Anesthesia: General | Site: Abdomen

## 2014-10-22 MED ORDER — 0.9 % SODIUM CHLORIDE (POUR BTL) OPTIME
TOPICAL | Status: DC | PRN
  Administered 2014-10-22: 1000 mL

## 2014-10-22 MED ORDER — PROMETHAZINE HCL 25 MG/ML IJ SOLN: 6.2500 mg | INTRAMUSCULAR | Status: DC | PRN

## 2014-10-22 MED ORDER — DIPHENHYDRAMINE HCL 50 MG/ML IJ SOLN
INTRAMUSCULAR | Status: DC | PRN
  Administered 2014-10-22: 12.5 mg via INTRAVENOUS

## 2014-10-22 MED ORDER — HYDROMORPHONE HCL 1 MG/ML IJ SOLN
INTRAMUSCULAR | Status: AC
  Filled 2014-10-22: qty 1

## 2014-10-22 MED ORDER — OXYCODONE-ACETAMINOPHEN 5-325 MG PO TABS: 1.0000 | ORAL_TABLET | ORAL | Status: DC | PRN

## 2014-10-22 MED ORDER — ONDANSETRON HCL 4 MG/2ML IJ SOLN
INTRAMUSCULAR | Status: DC | PRN
  Administered 2014-10-22 (×2): 4 mg via INTRAVENOUS

## 2014-10-22 MED ORDER — HYDROMORPHONE HCL 1 MG/ML IJ SOLN
0.2500 mg | INTRAMUSCULAR | Status: DC | PRN
  Administered 2014-10-22: 0.5 mg via INTRAVENOUS

## 2014-10-22 MED ORDER — ROCURONIUM BROMIDE 100 MG/10ML IV SOLN
INTRAVENOUS | Status: DC | PRN
  Administered 2014-10-22: 40 mg via INTRAVENOUS

## 2014-10-22 MED ORDER — NEOSTIGMINE METHYLSULFATE 10 MG/10ML IV SOLN
INTRAVENOUS | Status: AC
  Filled 2014-10-22: qty 2

## 2014-10-22 MED ORDER — ONDANSETRON 4 MG PO TBDP: ORAL_TABLET | ORAL | Status: DC

## 2014-10-22 MED ORDER — PROPOFOL 10 MG/ML IV BOLUS
INTRAVENOUS | Status: AC
  Filled 2014-10-22: qty 20

## 2014-10-22 MED ORDER — MIDAZOLAM HCL 5 MG/5ML IJ SOLN
INTRAMUSCULAR | Status: DC | PRN
  Administered 2014-10-22: 2 mg via INTRAVENOUS

## 2014-10-22 MED ORDER — DEXAMETHASONE SODIUM PHOSPHATE 4 MG/ML IJ SOLN
INTRAMUSCULAR | Status: DC | PRN
  Administered 2014-10-22: 4 mg via INTRAVENOUS

## 2014-10-22 MED ORDER — GLYCOPYRROLATE 0.2 MG/ML IJ SOLN
INTRAMUSCULAR | Status: DC | PRN
  Administered 2014-10-22: 0.2 mg via INTRAVENOUS
  Administered 2014-10-22: 0.6 mg via INTRAVENOUS

## 2014-10-22 MED ORDER — LACTATED RINGERS IV SOLN
INTRAVENOUS | Status: DC
  Administered 2014-10-22 (×3): via INTRAVENOUS

## 2014-10-22 MED ORDER — FENTANYL CITRATE 0.05 MG/ML IJ SOLN
INTRAMUSCULAR | Status: AC
  Filled 2014-10-22: qty 5

## 2014-10-22 MED ORDER — BUPIVACAINE-EPINEPHRINE (PF) 0.25% -1:200000 IJ SOLN
INTRAMUSCULAR | Status: AC
  Filled 2014-10-22: qty 30

## 2014-10-22 MED ORDER — PROPOFOL 10 MG/ML IV BOLUS
INTRAVENOUS | Status: DC | PRN
  Administered 2014-10-22 (×2): 10 mg via INTRAVENOUS
  Administered 2014-10-22: 180 mg via INTRAVENOUS

## 2014-10-22 MED ORDER — LIDOCAINE HCL (PF) 1 % IJ SOLN
INTRAMUSCULAR | Status: AC
  Filled 2014-10-22: qty 30

## 2014-10-22 MED ORDER — NEOSTIGMINE METHYLSULFATE 10 MG/10ML IV SOLN
INTRAVENOUS | Status: DC | PRN
  Administered 2014-10-22: 1 mg via INTRAVENOUS
  Administered 2014-10-22: 4 mg via INTRAVENOUS

## 2014-10-22 MED ORDER — LIDOCAINE HCL (CARDIAC) 20 MG/ML IV SOLN
INTRAVENOUS | Status: AC
  Filled 2014-10-22: qty 5

## 2014-10-22 MED ORDER — SODIUM CHLORIDE 0.9 % IR SOLN
Status: DC | PRN
  Administered 2014-10-22: 1000 mL

## 2014-10-22 MED ORDER — FENTANYL CITRATE 0.05 MG/ML IJ SOLN
INTRAMUSCULAR | Status: DC | PRN
  Administered 2014-10-22 (×5): 50 ug via INTRAVENOUS

## 2014-10-22 MED ORDER — ONDANSETRON HCL 4 MG/2ML IJ SOLN
INTRAMUSCULAR | Status: AC
  Filled 2014-10-22: qty 4

## 2014-10-22 MED ORDER — LIDOCAINE HCL (CARDIAC) 20 MG/ML IV SOLN
INTRAVENOUS | Status: DC | PRN
  Administered 2014-10-22: 50 mg via INTRAVENOUS

## 2014-10-22 MED ORDER — SODIUM CHLORIDE 0.9 % IV SOLN
INTRAVENOUS | Status: DC | PRN
  Administered 2014-10-22: 50 mL

## 2014-10-22 MED ORDER — SUCCINYLCHOLINE CHLORIDE 20 MG/ML IJ SOLN
INTRAMUSCULAR | Status: DC | PRN
  Administered 2014-10-22: 100 mg via INTRAVENOUS

## 2014-10-22 MED ORDER — LIDOCAINE HCL 1 % IJ SOLN
INTRAMUSCULAR | Status: DC | PRN
  Administered 2014-10-22: 40 mL via INTRAMUSCULAR

## 2014-10-22 MED ORDER — MIDAZOLAM HCL 2 MG/2ML IJ SOLN
INTRAMUSCULAR | Status: AC
  Filled 2014-10-22: qty 2

## 2014-10-22 MED ORDER — SODIUM CHLORIDE 0.9 % IJ SOLN
INTRAMUSCULAR | Status: AC
  Filled 2014-10-22: qty 10

## 2014-10-22 SURGICAL SUPPLY — 44 items
APPLIER CLIP ROT 10 11.4 M/L (STAPLE) ×3
BLADE SURG ROTATE 9660 (MISCELLANEOUS) ×3 IMPLANT
CANISTER SUCTION 2500CC (MISCELLANEOUS) ×3 IMPLANT
CHLORAPREP W/TINT 26ML (MISCELLANEOUS) ×3 IMPLANT
CLIP APPLIE ROT 10 11.4 M/L (STAPLE) ×1 IMPLANT
COVER MAYO STAND STRL (DRAPES) ×3 IMPLANT
COVER SURGICAL LIGHT HANDLE (MISCELLANEOUS) ×3 IMPLANT
DRAPE C-ARM 42X72 X-RAY (DRAPES) ×3 IMPLANT
DRAPE LAPAROSCOPIC ABDOMINAL (DRAPES) ×3 IMPLANT
DRAPE WARM FLUID 44X44 (DRAPE) ×3 IMPLANT
ELECT REM PT RETURN 9FT ADLT (ELECTROSURGICAL) ×3
ELECTRODE REM PT RTRN 9FT ADLT (ELECTROSURGICAL) ×1 IMPLANT
GLOVE BIO SURGEON STRL SZ 6 (GLOVE) ×3 IMPLANT
GLOVE BIO SURGEON STRL SZ8 (GLOVE) ×3 IMPLANT
GLOVE BIOGEL PI IND STRL 6.5 (GLOVE) ×1 IMPLANT
GLOVE BIOGEL PI IND STRL 7.0 (GLOVE) ×1 IMPLANT
GLOVE BIOGEL PI IND STRL 8 (GLOVE) ×1 IMPLANT
GLOVE BIOGEL PI INDICATOR 6.5 (GLOVE) ×2
GLOVE BIOGEL PI INDICATOR 7.0 (GLOVE) ×2
GLOVE BIOGEL PI INDICATOR 8 (GLOVE) ×2
GLOVE SURG SS PI 7.0 STRL IVOR (GLOVE) ×3 IMPLANT
GOWN STRL REUS W/ TWL LRG LVL3 (GOWN DISPOSABLE) ×2 IMPLANT
GOWN STRL REUS W/TWL 2XL LVL3 (GOWN DISPOSABLE) ×3 IMPLANT
GOWN STRL REUS W/TWL LRG LVL3 (GOWN DISPOSABLE) ×4
KIT BASIN OR (CUSTOM PROCEDURE TRAY) ×3 IMPLANT
KIT ROOM TURNOVER OR (KITS) ×3 IMPLANT
LIQUID BAND (GAUZE/BANDAGES/DRESSINGS) ×3 IMPLANT
NS IRRIG 1000ML POUR BTL (IV SOLUTION) ×3 IMPLANT
PAD ARMBOARD 7.5X6 YLW CONV (MISCELLANEOUS) ×3 IMPLANT
PENCIL BUTTON HOLSTER BLD 10FT (ELECTRODE) ×3 IMPLANT
POUCH SPECIMEN RETRIEVAL 10MM (ENDOMECHANICALS) ×3 IMPLANT
SCISSORS LAP 5X35 DISP (ENDOMECHANICALS) ×3 IMPLANT
SET CHOLANGIOGRAPH 5 50 .035 (SET/KITS/TRAYS/PACK) ×3 IMPLANT
SET IRRIG TUBING LAPAROSCOPIC (IRRIGATION / IRRIGATOR) ×3 IMPLANT
SLEEVE ENDOPATH XCEL 5M (ENDOMECHANICALS) ×3 IMPLANT
SPECIMEN JAR SMALL (MISCELLANEOUS) ×3 IMPLANT
SUT MNCRL AB 4-0 PS2 18 (SUTURE) ×3 IMPLANT
TOWEL OR 17X24 6PK STRL BLUE (TOWEL DISPOSABLE) ×3 IMPLANT
TOWEL OR 17X26 10 PK STRL BLUE (TOWEL DISPOSABLE) ×3 IMPLANT
TRAY LAPAROSCOPIC (CUSTOM PROCEDURE TRAY) ×3 IMPLANT
TROCAR XCEL BLUNT TIP 100MML (ENDOMECHANICALS) ×3 IMPLANT
TROCAR XCEL NON-BLD 11X100MML (ENDOMECHANICALS) ×3 IMPLANT
TROCAR XCEL NON-BLD 5MMX100MML (ENDOMECHANICALS) ×3 IMPLANT
TUBING INSUFFLATION (TUBING) ×3 IMPLANT

## 2014-10-22 NOTE — Progress Notes (Signed)
Dr Randa EvensEdwards in to see PT.

## 2014-10-22 NOTE — H&P (Signed)
[facility] Location: [facility] Patient #: [id number] DOB: 1975/11/20 Separated / Language: [name] / Race: Black or African American Female  History of Present Illness Patient words: gallbladder.  The patient is a 39 year old female who presents for evaluation of gall stones. Patient is referred by Dr. Loren Racer from the emergency department for consultation regarding her gallstones. Patient is a 39 year old female who presents with complaints of epigastric and right upper quadrant pain. She was diagnosed with gallstones last August in [facility]. She has noticed that she has an attack of pain every time she eats greasy foods, but they always go away on their own within a few hours. She had a severe attack on Valentine's Day after eating pizza and cookies. The pain was so severe that she had to go to the emergency department again. She was found to have borderline thickness of the gallbladder wall and numerous gallstones. Since that time, she has remained fairly sore. The severe pain has resolved. She did have nausea but no vomiting. She also had a temperature of 100.7 on Valentine's Day.   Other Problems  Gastroesophageal Reflux Disease  Past Surgical History Cesarean Section - 1 Colon Polyp Removal - Open  Diagnostic Studies History Mammogram never  Allergies No Known Drug Allergies  Medication History Oxycodone-Acetaminophen (5-325MG  Tablet, Oral) Active. Valsartan-Hydrochlorothiazide (160-12.5MG  Tablet, Oral) Active. Ondansetron (  Tablet Disperse, Oral) Active. Amphetamine-Dextroamphet ER (  Capsule ER 24HR, Oral) Active. Amphetamine-Dextroamphet ER (  Capsule ER 24HR, Oral) Active. Amphetamine-Dextroamphet ER (  Capsule ER 24HR, Oral) Active.  Social History Alcohol use Occasional alcohol use. Caffeine use Coffee. No drug use Tobacco use Current every day smoker.  Family History Hypertension Father,  Mother.  Pregnancy / Birth History Age at menarche 14 years. Contraceptive History Contraceptive implant. Gravida 2  Review of Systems General Present- Appetite Loss. Not Present- Chills, Fatigue, Fever, Night Sweats, Weight Gain and Weight Loss. Skin Not Present- Change in Wart/Mole, Dryness, Hives, Jaundice, New Lesions, Non-Healing Wounds, Rash and Ulcer. HEENT Not Present- Earache, Hearing Loss, Hoarseness, Nose Bleed, Oral Ulcers, Ringing in the Ears, Seasonal Allergies, Sinus Pain, Sore Throat, Visual Disturbances, Wears glasses/contact lenses and Yellow Eyes. Respiratory Not Present- Bloody sputum, Chronic Cough, Difficulty Breathing, Snoring and Wheezing. Breast Not Present- Breast Mass, Breast Pain, Nipple Discharge and Skin Changes. Cardiovascular Not Present- Chest Pain, Difficulty Breathing Lying Down, Leg Cramps, Palpitations, Rapid Heart Rate, Shortness of Breath and Swelling of Extremities. Gastrointestinal Present- Bloating. Not Present- Abdominal Pain, Bloody Stool, Change in Bowel Habits, Chronic diarrhea, Constipation, Difficulty Swallowing, Excessive gas, Gets full quickly at meals, Hemorrhoids, Indigestion, Nausea, Rectal Pain and Vomiting. Female Genitourinary Not Present- Frequency, Nocturia, Painful Urination, Pelvic Pain and Urgency. Musculoskeletal Not Present- Back Pain, Joint Pain, Joint Stiffness, Muscle Pain, Muscle Weakness and Swelling of Extremities. Neurological Not Present- Decreased Memory, Fainting, Headaches, Numbness, Seizures, Tingling, Tremor, Trouble walking and Weakness. Psychiatric Not Present- Anxiety, Bipolar, Change in Sleep Pattern, Depression, Fearful and Frequent crying. Endocrine Not Present- Cold Intolerance, Excessive Hunger, Hair Changes, Heat Intolerance, Hot flashes and New Diabetes. Hematology Not Present- Easy Bruising, Excessive bleeding, Gland problems, HIV and Persistent Infections.   Vitals  Wt Readings from Last 3 Encounters:   10/22/14 88.225 kg (194 lb 8 oz)  08/30/14 88.451 kg (195 lb)   Temp Readings from Last 3 Encounters:  10/22/14 97 F (36.1 C) Oral  08/30/14 97.5 F (36.4 C) Oral   BP Readings from Last 3 Encounters:  10/22/14 97/51  08/30/14 112/61  Pulse Readings from Last 3 Encounters:  10/22/14 58  08/30/14 73    Weight: 192 lb Height: 69in Body Surface Area: 2.06 m Body Mass Index: 28.35 kg/m Temp.: 55F(Temporal)  Pulse: 66 (Regular)  BP: 122/68 (Sitting, Left Arm, Standard)    Physical Exam  General Mental Status-Alert. General Appearance-Consistent with stated age. Hydration-Well hydrated. Voice-Normal.  Head and Neck Head-normocephalic, atraumatic with no lesions or palpable masses. Trachea-midline. Thyroid Gland Characteristics - normal size and consistency.  Eye Eyeball - Bilateral-Extraocular movements intact. Sclera/Conjunctiva - Bilateral-No scleral icterus.  Chest and Lung Exam Chest and lung exam reveals -quiet, even and easy respiratory effort with no use of accessory muscles and on auscultation, normal breath sounds, no adventitious sounds and normal vocal resonance. Inspection Chest Wall - Normal. Back - normal.  Cardiovascular Cardiovascular examination reveals -normal heart sounds, regular rate and rhythm with no murmurs and normal pedal pulses bilaterally.  Abdomen Inspection Inspection of the abdomen reveals - No Hernias. Palpation/Percussion Palpation and Percussion of the abdomen reveal - Soft, No Rebound tenderness, No Rigidity (guarding) and No hepatosplenomegaly. Tenderness - Epigastrium and Right Upper Quadrant. Auscultation Auscultation of the abdomen reveals - Bowel sounds normal.  Neurologic Neurologic evaluation reveals -alert and oriented x 3 with no impairment of recent or remote memory. Mental Status-Normal.  Musculoskeletal Global Assessment -Note: no gross deformities.  Normal Exam -  Left-Upper Extremity Strength Normal and Lower Extremity Strength Normal. Normal Exam - Right-Upper Extremity Strength Normal and Lower Extremity Strength Normal.  Lymphatic Head & Neck  General Head & Neck Lymphatics: Bilateral - Description - Normal. Axillary  General Axillary Region: Bilateral - Description - Normal. Tenderness - Non Tender. Femoral & Inguinal  Generalized Femoral & Inguinal Lymphatics: Bilateral - Description - No Generalized lymphadenopathy.    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 She does remain tender in the right upper quadrant and epigastrium. I do think she will need to go ahead and have a laparoscopic cholecystectomy. I reviewed the surgery with the patient as well as time off work. I reviewed the recovery period and restrictions.  The surgical procedure was described to the patient in detail. The patient was given educational material. I discussed the incision type and location, the location of the gallbladder, the anatomy of the bile ducts and arteries, and the typical progression of surgery. I discussed the possibility of converting to an open operation. I advised of the risks of bleeding, infection, damage to other structures (such as the bile duct, intestine or liver), bile leak, need for other procedures or surgeries, and post op diarrhea/constipation. We discussed the risk of blood clot. We discussed the recovery period and post operative restrictions. The patient was advised against taking blood thinners the week before surgery. Current Plans  Pt Education - Laparoscopic Cholecystectomy: gallbladder Schedule for Surgery   Signed by Almond LintFaera Erminio Nygard, MD

## 2014-10-22 NOTE — Anesthesia Procedure Notes (Signed)
Procedure Name: Intubation Date/Time: 10/22/2014 12:23 PM Performed by: Brien MatesMAHONY, Laroy Mustard D Pre-anesthesia Checklist: Patient identified, Emergency Drugs available, Suction available, Patient being monitored and Timeout performed Patient Re-evaluated:Patient Re-evaluated prior to inductionOxygen Delivery Method: Circle system utilized Preoxygenation: Pre-oxygenation with 100% oxygen Intubation Type: IV induction, Rapid sequence and Cricoid Pressure applied Laryngoscope Size: Miller and 2 Grade View: Grade I Tube type: Oral Tube size: 7.0 mm Number of attempts: 1 Airway Equipment and Method: Stylet Placement Confirmation: ETT inserted through vocal cords under direct vision,  positive ETCO2 and breath sounds checked- equal and bilateral Secured at: 21 cm Tube secured with: Tape Dental Injury: Teeth and Oropharynx as per pre-operative assessment

## 2014-10-22 NOTE — Anesthesia Postprocedure Evaluation (Signed)
  Anesthesia Post-op Note  Patient: Faith MartinezMarquila N Wells  Procedure(s) Performed: Procedure(s): LAPAROSCOPIC CHOLECYSTECTOMY WITH INTRAOPERATIVE CHOLANGIOGRAM (N/A)  Patient Location: PACU  Anesthesia Type:General  Level of Consciousness: awake  Airway and Oxygen Therapy: Patient Spontanous Breathing  Post-op Pain: mild  Post-op Assessment: Post-op Vital signs reviewed  Post-op Vital Signs: Reviewed  Last Vitals:  Filed Vitals:   10/22/14 1535  BP: 148/83  Pulse:   Temp:   Resp: 22    Complications: No apparent anesthesia complications

## 2014-10-22 NOTE — Transfer of Care (Signed)
Immediate Anesthesia Transfer of Care Note  Patient: Faith MartinezMarquila N Wells  Procedure(s) Performed: Procedure(s): LAPAROSCOPIC CHOLECYSTECTOMY WITH INTRAOPERATIVE CHOLANGIOGRAM (N/A)  Patient Location: PACU  Anesthesia Type:General  Level of Consciousness: awake, alert  and oriented  Airway & Oxygen Therapy: Patient Spontanous Breathing and Patient connected to nasal cannula oxygen  Post-op Assessment: Report given to RN  Post vital signs: Reviewed and stable  Last Vitals:  Filed Vitals:   10/22/14 1040  BP: 97/51  Pulse: 58  Temp:   Resp:     Complications: No apparent anesthesia complications

## 2014-10-22 NOTE — Anesthesia Preprocedure Evaluation (Addendum)
Anesthesia Evaluation  Patient identified by MRN, date of birth, ID band Patient awake    Reviewed: Allergy & Precautions, NPO status , Patient's Chart, lab work & pertinent test results  Airway Mallampati: II  TM Distance: >3 FB Neck ROM: Full    Dental  (+) Teeth Intact, Dental Advisory Given   Pulmonary asthma , Current Smoker,  breath sounds clear to auscultation        Cardiovascular hypertension, Rhythm:Regular Rate:Normal     Neuro/Psych    GI/Hepatic Neg liver ROS, GERD-  ,  Endo/Other  negative endocrine ROS  Renal/GU negative Renal ROS     Musculoskeletal   Abdominal   Peds  Hematology   Anesthesia Other Findings   Reproductive/Obstetrics                           Anesthesia Physical Anesthesia Plan  ASA: II  Anesthesia Plan: General   Post-op Pain Management:    Induction: Intravenous  Airway Management Planned: Oral ETT  Additional Equipment:   Intra-op Plan:   Post-operative Plan: Extubation in OR  Informed Consent: I have reviewed the patients History and Physical, chart, labs and discussed the procedure including the risks, benefits and alternatives for the proposed anesthesia with the patient or authorized representative who has indicated his/her understanding and acceptance.   Dental advisory given  Plan Discussed with: CRNA, Anesthesiologist and Surgeon  Anesthesia Plan Comments:        Anesthesia Quick Evaluation

## 2014-10-22 NOTE — Discharge Instructions (Signed)
[facility]      [facility] [place] [phone]  ABDOMINAL SURGERY: POST OP INSTRUCTIONS  Always review your discharge instruction sheet given to you by the facility where your surgery was performed.  IF YOU HAVE DISABILITY OR FAMILY LEAVE FORMS, YOU MUST BRING THEM TO THE OFFICE FOR PROCESSING.  PLEASE DO NOT GIVE THEM TO YOUR DOCTOR.  1. A prescription for pain medication may be given to you upon discharge.  Take your pain medication as prescribed, if needed.  If narcotic pain medicine is not needed, then you may take acetaminophen (Tylenol) or ibuprofen (Advil) as needed. 2. Take your usually prescribed medications unless otherwise directed. 3. If you need a refill on your pain medication, please contact your pharmacy. They will contact our office to request authorization.  Prescriptions will not be filled after 5pm or on week-ends. 4. You should follow a light diet the first few days after arrival home, such as soup and crackers, pudding, etc.unless your doctor has advised otherwise. A high-fiber, low fat diet can be resumed as tolerated.   Be sure to include lots of fluids daily. Most patients will experience some swelling and bruising on the chest and neck area.  Ice packs will help.  Swelling and bruising can take several days to resolve 5. Most patients will experience some swelling and bruising in the area of the incision. Ice pack will help. Swelling and bruising can take several days to resolve..  6. It is common to experience some constipation if taking pain medication after surgery.  Increasing fluid intake and taking a stool softener will usually help or prevent this problem from occurring.  A mild laxative (Milk of Magnesia or Miralax) should be taken according to package directions if there are no bowel movements after 48 hours. 7.  You may have steri-strips (small skin tapes) in place directly over the incision.  These strips should be left on the skin for 10-14 days.  If your  surgeon used skin glue on the incision, you may shower in 48 hours.  The glue will flake off over the next 2-3 weeks.  Any sutures or staples will be removed at the office during your follow-up visit. You may find that a light gauze bandage over your incision may keep your staples from being rubbed or pulled. You may shower and replace the bandage daily. 8. ACTIVITIES:  You may resume regular (light) daily activities beginning the next day--such as daily self-care, walking, climbing stairs--gradually increasing activities as tolerated.  You may have sexual intercourse when it is comfortable.  Refrain from any heavy lifting or straining until approved by your doctor. a. You may drive when you no longer are taking prescription pain medication, you can comfortably wear a seatbelt, and you can safely maneuver your car and apply brakes b. Return to Work: __________1-4 weeks if applicable_________________________ 9. You should see your doctor in the office for a follow-up appointment approximately two weeks after your surgery.  Make sure that you call for this appointment within a day or two after you arrive home to insure a convenient appointment time. OTHER INSTRUCTIONS:  _____________________________________________________________ _____________________________________________________________  WHEN TO CALL YOUR DOCTOR: 1. Fever over 101.0 2. Inability to urinate 3. Nausea and/or vomiting 4. Extreme swelling or bruising 5. Continued bleeding from incision. 6. Increased pain, redness, or drainage from the incision. 7. Difficulty swallowing or breathing 8. Muscle cramping or spasms. 9. Numbness or tingling in hands or feet or around lips.  The clinic staff is  available to answer your questions during regular business hours.  Please don’t hesitate to call and ask to speak to one of the nurses if you have concerns. ° °For further questions, please visit [email or web address] ° ° ° °

## 2014-10-22 NOTE — Op Note (Signed)
Laparoscopic Cholecystectomy with IOC Procedure Note  Indications: This patient presents with chronic calculous cholecystitis and will undergo laparoscopic cholecystectomy.  Pre-operative Diagnosis: see above  Post-operative Diagnosis: Same  Surgeon: Almond Lint   Assistants: n/a  Anesthesia: General endotracheal anesthesia and local  ASA Class: 2  Procedure Details  The patient was seen again in the Holding Room. The risks, benefits, complications, treatment options, and expected outcomes were discussed with the patient. The possibilities of  bleeding, recurrent infection, damage to nearby structures, the need for additional procedures, failure to diagnose a condition, the possible need to convert to an open procedure, and creating a complication requiring transfusion or operation were discussed with the patient. The likelihood of improving the patient's symptoms with return to their baseline status is good.    The patient and/or family concurred with the proposed plan, giving informed consent. The site of surgery properly noted. The patient was taken to Operating Room, and the procedure verified as Laparoscopic Cholecystectomy with Intraoperative Cholangiogram. A Time Out was held and the above information confirmed.  Prior to the induction of general anesthesia, antibiotic prophylaxis was administered. General endotracheal anesthesia was then administered and tolerated well. After the induction, the abdomen was prepped with Chloraprep and draped in the sterile fashion. The patient was positioned in the supine position.  Local anesthetic agent was injected into the skin near the umbilicus and an incision made. We dissected down to the abdominal fascia with blunt dissection.  The fascia was incised vertically and we entered the peritoneal cavity bluntly.  A pursestring suture of 0-Vicryl was placed around the fascial opening.  The Hasson cannula was inserted and secured with the stay suture.   Pneumoperitoneum was then created with CO2 and tolerated well without any adverse changes in the patient's vital signs. An 11-mm port was placed in the subxiphoid position.  Two 5-mm ports were placed in the right upper quadrant. All skin incisions were infiltrated with a local anesthetic agent before making the incision and placing the trocars.   We positioned the patient in reverse Trendelenburg, tilted slightly to the patient's left.  The gallbladder was identified, the fundus grasped and retracted cephalad. Adhesions were lysed bluntly and with the electrocautery where indicated, taking care not to injure any adjacent organs or viscus. The infundibulum was grasped and retracted laterally, exposing the peritoneum overlying the triangle of Calot. This was then divided and exposed in a blunt fashion. A critical view of the cystic duct and cystic artery was obtained.  The cystic duct was clearly identified and bluntly dissected circumferentially. The cystic duct was ligated with a clip distally.   An incision was made in the cystic duct and the [facility] cholangiogram catheter introduced. The catheter was secured using a clip. A cholangiogram was then performed, demonstrating good filling of the left and right hepatic duct, the common bile duct and the duodenum without filling defects.  .  The cystic duct was then ligated with clips and divided. The cystic artery was identified, dissected free, ligated with clips and divided as well.   The gallbladder was dissected from the liver bed in retrograde fashion with the electrocautery. The gallbladder was removed and placed in an Endocatch bag.  The gallbladder and Endocatch bag were then removed through the umbilical port site.  The liver bed was irrigated and inspected. Hemostasis was achieved with the electrocautery. Copious irrigation was utilized and was repeatedly aspirated until clear.    We again inspected the right upper quadrant for hemostasis.  Pneumoperitoneum was released as we removed the trocars.   The pursestring suture was used to close the umbilical fascia.  4-0 Monocryl was used to close the skin.   The skin was cleaned and dry, and Dermabond was applied. The patient was then extubated and brought to the recovery room in stable condition. Instrument, sponge, and needle counts were correct at closure and at the conclusion of the case.   Findings: Good critical view.  Normal cholangiogram.  Chronic inflammation  Estimated Blood Loss: min         Drains: none          Specimens: Gallbladder to pathology       Complications: None; patient tolerated the procedure well.         Disposition: PACU - hemodynamically stable.         Condition: stable

## 2014-10-22 NOTE — Progress Notes (Addendum)
Pt voices concern related to her BP being low. She also states she is having pain in her abdomen moving up into her chest area.  When asked if she is having nausea states "I can't tell, I just hurt".  Pt rates her pain as a 7 on pain scale. Dr Randa EvensEdwards called and states she will be over to see her.

## 2014-10-22 NOTE — Anesthesia Postprocedure Evaluation (Signed)
  Anesthesia Post-op Note  Patient: Faith Wells  Procedure(s) Performed: Procedure(s): LAPAROSCOPIC CHOLECYSTECTOMY WITH INTRAOPERATIVE CHOLANGIOGRAM (N/A)  Patient Location: PACU  Anesthesia Type:General  Level of Consciousness: awake  Airway and Oxygen Therapy: Patient Spontanous Breathing  Post-op Pain: mild  Post-op Assessment: Post-op Vital signs reviewed  Post-op Vital Signs: Reviewed  Last Vitals:  Filed Vitals:   10/22/14 1535  BP: 148/83  Pulse:   Temp:   Resp: 22    Complications: No apparent anesthesia complications 

## 2014-10-23 ENCOUNTER — Encounter (HOSPITAL_COMMUNITY): Payer: Self-pay | Admitting: General Surgery

## 2015-02-23 DIAGNOSIS — Y998 Other external cause status: Secondary | ICD-10-CM | POA: Insufficient documentation

## 2015-02-23 DIAGNOSIS — Z720 Tobacco use: Secondary | ICD-10-CM | POA: Insufficient documentation

## 2015-02-23 DIAGNOSIS — J45909 Unspecified asthma, uncomplicated: Secondary | ICD-10-CM | POA: Insufficient documentation

## 2015-02-23 DIAGNOSIS — I10 Essential (primary) hypertension: Secondary | ICD-10-CM | POA: Diagnosis not present

## 2015-02-23 DIAGNOSIS — Z3202 Encounter for pregnancy test, result negative: Secondary | ICD-10-CM | POA: Diagnosis not present

## 2015-02-23 DIAGNOSIS — F329 Major depressive disorder, single episode, unspecified: Secondary | ICD-10-CM | POA: Diagnosis not present

## 2015-02-23 DIAGNOSIS — Z0441 Encounter for examination and observation following alleged adult rape: Secondary | ICD-10-CM | POA: Diagnosis present

## 2015-02-23 DIAGNOSIS — Z8719 Personal history of other diseases of the digestive system: Secondary | ICD-10-CM | POA: Diagnosis not present

## 2015-02-23 DIAGNOSIS — Y9389 Activity, other specified: Secondary | ICD-10-CM | POA: Diagnosis not present

## 2015-02-23 DIAGNOSIS — T7421XA Adult sexual abuse, confirmed, initial encounter: Secondary | ICD-10-CM | POA: Diagnosis not present

## 2015-02-23 DIAGNOSIS — F419 Anxiety disorder, unspecified: Secondary | ICD-10-CM | POA: Diagnosis not present

## 2015-02-23 DIAGNOSIS — Y92009 Unspecified place in unspecified non-institutional (private) residence as the place of occurrence of the external cause: Secondary | ICD-10-CM | POA: Insufficient documentation

## 2015-02-24 ENCOUNTER — Emergency Department (HOSPITAL_COMMUNITY)
Admission: EM | Admit: 2015-02-24 | Discharge: 2015-02-24 | Disposition: A | Discharge status: Home / Self Care | Payer: BLUE CROSS/BLUE SHIELD | Attending: Emergency Medicine | Admitting: Emergency Medicine

## 2015-02-24 ENCOUNTER — Encounter (HOSPITAL_COMMUNITY): Payer: Self-pay | Admitting: *Deleted

## 2015-02-24 DIAGNOSIS — T7421XA Adult sexual abuse, confirmed, initial encounter: Secondary | ICD-10-CM

## 2015-02-24 LAB — POC URINE PREG, ED: Preg Test, Ur: NEGATIVE

## 2015-02-24 MED ORDER — AZITHROMYCIN 1 G PO PACK
PACK | ORAL | Status: AC
  Administered 2015-02-24: 1 g
  Filled 2015-02-24: qty 1

## 2015-02-24 MED ORDER — HEPATITIS B VAC RECOMBINANT 10 MCG/ML IJ SUSP
1.0000 mL | Freq: Once | INTRAMUSCULAR | Status: AC
  Administered 2015-02-24: 10 ug via INTRAMUSCULAR
  Filled 2015-02-24: qty 1

## 2015-02-24 MED ORDER — TETANUS-DIPHTH-ACELL PERTUSSIS 5-2.5-18.5 LF-MCG/0.5 IM SUSP
0.5000 mL | Freq: Once | INTRAMUSCULAR | Status: AC
  Administered 2015-02-24: 0.5 mL via INTRAMUSCULAR
  Filled 2015-02-24: qty 0.5

## 2015-02-24 MED ORDER — METRONIDAZOLE 500 MG PO TABS
ORAL_TABLET | ORAL | Status: AC
  Administered 2015-02-24: 2000 mg
  Filled 2015-02-24: qty 4

## 2015-02-24 MED ORDER — CEFIXIME 400 MG PO CAPS
ORAL_CAPSULE | ORAL | Status: AC
  Administered 2015-02-24: 400 mg
  Filled 2015-02-24: qty 1

## 2015-02-24 MED ORDER — ULIPRISTAL ACETATE 30 MG PO TABS
ORAL_TABLET | ORAL | Status: AC
  Filled 2015-02-24: qty 1

## 2015-02-24 MED ORDER — PROMETHAZINE HCL 25 MG PO TABS
ORAL_TABLET | ORAL | Status: AC
  Administered 2015-02-24: 75 mg
  Filled 2015-02-24: qty 3
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
## 2015-02-24 NOTE — Discharge Instructions (Signed)
Sexual Assault or Rape  Sexual assault is any sexual activity that a person is forced, threatened, or coerced into participating in. It may or may not involve physical contact. You are being sexually abused if you are forced to have sexual contact of any kind. Sexual assault is called rape if penetration has occurred (vaginal, oral, or anal). Many times, sexual assaults are committed by a friend, relative, or associate. Sexual assault and rape are never the victim's fault.   Sexual assault can result in various health problems for the person who was assaulted. Some of these problems include:  · Physical injuries in the genital area or other areas of the body.  · Risk of unwanted pregnancy.  · Risk of sexually transmitted infections (STIs).  · Psychological problems such as anxiety, depression, or posttraumatic stress disorder.  WHAT STEPS SHOULD BE TAKEN AFTER A SEXUAL ASSAULT?  If you have been sexually assaulted, you should take the following steps as soon as possible:  · Go to a safe area as quickly as possible and call your local emergency services (911 in [place] Get away from the area where you have been attacked.    · Do not wash, shower, comb your hair, or clean any part of your body.    · Do not change your clothes.    · Do not remove or touch anything in the area where you were assaulted.    · Go to an emergency room for a complete physical exam. Get the necessary tests to protect yourself from STIs or pregnancy. You may be treated for an STI even if no signs of one are present. Emergency contraceptive medicines are also available to help prevent pregnancy, if this is desired. You may need to be examined by a specially trained health care provider.  · Have the health care provider collect evidence during the exam, even if you are not sure if you will file a report with the police.  · Find out how to file the correct papers with the authorities. This is important for all assaults, even if they were committed  by a family member or friend.  · Find out where you can get additional help and support, such as a local rape crisis center.  · Follow up with your health care provider as directed.    HOW CAN YOU REDUCE THE CHANCES OF SEXUAL ASSAULT?  Take the following steps to help reduce your chances of being sexually assaulted:  · Consider carrying mace or pepper spray for protection against an attacker.    · Consider taking a self-defense course.  · Do not try to fight off an attacker if he or she has a gun or knife.    · Be aware of your surroundings, what is happening around you, and who might be there.    · Be assertive, trust your instincts, and walk with confidence and direction.  · Be careful not to drink too much alcohol or use other intoxicants. These can reduce your ability to fight off an assault.  · Always lock your doors and windows. Be sure to have high-quality locks for your home.    · Do not let people enter your house if you do not know them.    · Get a home security system that has a siren if you are able.    · Protect the keys to your house and car. Do not lend them out. Do not put your name and address on them. If you lose them, get your locks changed.    · Always   lock your car and have your key ready to open the door before approaching the car.    · Park in a well-lit and busy area.  · Plan your driving routes so that you travel on well-lit and frequently used streets.   · Keep your car serviced. Always have at least half a tank of gas in it.    · Do not go into isolated areas alone. This includes open garages, empty buildings or offices, or public laundry rooms.    · Do not walk or jog alone, especially when it is dark.    · Never hitchhike.    · If your car breaks down, call the police for help on your cell phone and stay inside the car with your doors locked and windows up.    · If you are being followed, go to a busy area and call for help.    · If you are stopped by a police officer, especially one in  an unmarked police car, keep your door locked. Do not put your window down all the way. Ask the officer to show you identification first.    · Be aware of "date rape drugs" that can be placed in a drink when you are not looking. These drugs can make you unable to fight off an assault.  FOR MORE INFORMATION  · Office on Women's Health, [place] Department of Health and Human Services: www.womenshealth.gov/violence-against-women/types-of-violence/sexual-assault-and-abuse.html  · National Sexual Assault Hotline: 1-800-656-HOPE (4673)  · National Domestic Violence Hotline: 1-800-799-SAFE (7233) or www.thehotline.org  Document Released: 06/30/2000 Document Revised: 03/05/2013 Document Reviewed: 12/04/2012  ExitCare® Patient Information ©2015 [employer]. This information is not intended to replace advice given to you by your health care provider. Make sure you discuss any questions you have with your health care provider.

## 2015-02-24 NOTE — ED Notes (Signed)
Pt stable, ambulatory, states understanding of discharge instructions 
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
## 2015-02-24 NOTE — ED Notes (Signed)
Pt would like to see SANE nurse once medically cleared.

## 2015-02-24 NOTE — ED Notes (Signed)
SANE nurse on her way in
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
## 2017-04-24 ENCOUNTER — Other Ambulatory Visit: Payer: Self-pay | Admitting: Obstetrics and Gynecology

## 2018-03-29 ENCOUNTER — Encounter (HOSPITAL_COMMUNITY): Payer: Self-pay | Admitting: Emergency Medicine

## 2018-03-29 ENCOUNTER — Emergency Department (HOSPITAL_COMMUNITY)
Admission: EM | Admit: 2018-03-29 | Discharge: 2018-03-30 | Disposition: A | Discharge status: Home / Self Care | Payer: Medicaid Other | Attending: Emergency Medicine | Admitting: Emergency Medicine

## 2018-03-29 DIAGNOSIS — J45909 Unspecified asthma, uncomplicated: Secondary | ICD-10-CM | POA: Insufficient documentation

## 2018-03-29 DIAGNOSIS — F32A Depression, unspecified: Secondary | ICD-10-CM

## 2018-03-29 DIAGNOSIS — F329 Major depressive disorder, single episode, unspecified: Secondary | ICD-10-CM

## 2018-03-29 DIAGNOSIS — I10 Essential (primary) hypertension: Secondary | ICD-10-CM | POA: Insufficient documentation

## 2018-03-29 DIAGNOSIS — Z79899 Other long term (current) drug therapy: Secondary | ICD-10-CM | POA: Insufficient documentation

## 2018-03-29 DIAGNOSIS — F333 Major depressive disorder, recurrent, severe with psychotic symptoms: Secondary | ICD-10-CM | POA: Diagnosis not present

## 2018-03-29 DIAGNOSIS — F17200 Nicotine dependence, unspecified, uncomplicated: Secondary | ICD-10-CM | POA: Insufficient documentation

## 2018-03-29 LAB — I-STAT BETA HCG BLOOD, ED (MC, WL, AP ONLY): I-stat hCG, quantitative: <5 m[IU]/mL (ref <5)

## 2018-03-29 LAB — RAPID URINE DRUG SCREEN, HOSP PERFORMED
Amphetamines: NOT DETECTED
BARBITURATES: NOT DETECTED
Benzodiazepines: NOT DETECTED
COCAINE: NOT DETECTED
Opiates: NOT DETECTED
TETRAHYDROCANNABINOL: NOT DETECTED

## 2018-03-29 LAB — CBC
HCT: 37.2 % (ref 36.0–46.0)
HEMOGLOBIN: 11.9 g/dL — AB (ref 12.0–15.0)
MCH: 29.8 pg (ref 26.0–34.0)
MCHC: 32 g/dL (ref 30.0–36.0)
MCV: 93 fL (ref 78.0–100.0)
Platelets: 245 10*3/uL (ref 150–400)
RBC: 4 MIL/uL (ref 3.87–5.11)
RDW: 13.2 % (ref 11.5–15.5)
WBC: 5.5 10*3/uL (ref 4.0–10.5)

## 2018-03-29 LAB — BASIC METABOLIC PANEL
ANION GAP: 8 (ref 5–15)
BUN: 7 mg/dL (ref 6–20)
CHLORIDE: 109 mmol/L (ref 98–111)
CO2: 24 mmol/L (ref 22–32)
CREATININE: 0.75 mg/dL (ref 0.44–1.00)
Calcium: 9.8 mg/dL (ref 8.9–10.3)
GFR calc non Af Amer: >60 mL/min (ref >60)
Glucose, Bld: 93 mg/dL (ref 70–99)
Potassium: 3.5 mmol/L (ref 3.5–5.1)
Sodium: 141 mmol/L (ref 135–145)

## 2018-03-29 LAB — ETHANOL: Alcohol, Ethyl (B): <10 mg/dL (ref <10)

## 2018-03-29 NOTE — ED Provider Notes (Signed)
Patient placed in Quick Look pathway, seen and evaluated   Chief Complaint: Anxiety, depression, fatigue  HPI: Patient presents with worsening depression, anxiety, and feeling very tired.  She states that when she gets very anxious she has tingling in her forehead and wakes in the middle night with shortness of breath.  Mother at bedside reports paranoia, mood swings.  Patient denies any recent medical issues.  She discontinued her depression medication about 2 weeks ago.  She denies drugs or alcohol.  No active SI/HI.   ROS:  Positive ROS: (+) Depression, anxiety Negative ROS: (-) Fevers, cough, chest pain, SI/HI  Physical Exam:   Gen: No distress  Neuro: Awake and Alert  Skin: Warm    Focused Exam: Heart RRR, nml S1,S2, no m/r/g; Lungs CTAB; Abd soft, NT, no rebound or guarding; Ext 2+ pedal pulses bilaterally, no edema; Psych flat affect, denies SI.   BP (!) 145/95 (BP Location: Left Arm)   Pulse 64   Temp 99 F (37.2 C) (Oral)   Resp 16   SpO2 99%   Plan: We will medically clear given reported fatigue and medically clear.  TTS work-up ordered for severe depression and anxiety.   Initiation of care has begun. The patient has been counseled on the process, plan, and necessity for staying for the completion/evaluation, and the remainder of the medical screening examination    Faith Wells, Faith Wells, Cordelia Poche-C 03/29/18 1707    Mesner, Barbara CowerJason, MD 03/30/18 [phone]

## 2018-03-29 NOTE — BH Assessment (Signed)
Checked with triage regarding TTS assessment. Pt is not in room yet.   Harlin RainFord Ellis Patsy BaltimoreWarrick Jr, LPC, [facility] HospitalN[facility], [facility] Triage Specialist [phone]

## 2018-03-29 NOTE — ED Provider Notes (Signed)
[facility] Provider Note   CSN: [id number] Arrival date & time: 03/29/18  1623     History   Chief Complaint Chief Complaint  Patient presents with  . Medical Clearance    HPI Faith Wells is a 42 y.o. female.  The history is provided by the patient, medical records and a relative.    42 year old female with history of anxiety, asthma, depression, GERD, hypertension, presenting to the ED for medical clearance.  Patient reports over the past 2 weeks she has had kind of a "downward spiral".  States she was started on some depression medication by her primary care doctor but stopped taking it 2 weeks ago as she went to pick it up from the pharmacy and it was $280.  States she had been on it for a while, seemed to work well while taking it.  States since then she has had worsening depression, has no motivation to get up, is not sleeping and is not eating.  States at night she tries to sleep but has nightmares and wakes up, starts hearing voices and cannot go back to sleep, also occasionally feels like there are "bugs crawling on her" preventing sleep.  Mother at the bedside reports she has had some paranoia about her surroundings and has had difficulty relaxing, especially out in public.  States she is also had some "memory issues" but has not seemed confused or altered.  Patient states she does not like how she is feeling and feels like it is impacting her ability to care for herself.  She denies SI/HI.  Denies EtOH or illicit drugs.  States she was seeing a Veterinary surgeoncounselor and was referred to psychiatry, however has not yet had an appt.  Past Medical History:  Diagnosis Date  . Anxiety   . Asthma   . Depression   . GERD (gastroesophageal reflux disease)   . Hypertension     There are no active problems to display for this patient.   Past Surgical History:  Procedure Laterality Date  . CESAREAN SECTION    . CHOLECYSTECTOMY N/A 10/22/2014   Procedure: LAPAROSCOPIC CHOLECYSTECTOMY WITH INTRAOPERATIVE CHOLANGIOGRAM;  Surgeon: Almond LintFaera Byerly, MD;  Location: [facility] OR;  Service: General;  Laterality: N/A;  . HEMORRHOID SURGERY    . RECTAL POLYPECTOMY       OB History   None      Home Medications    Prior to Admission medications   Medication Sig Start Date End Date Taking? Authorizing Provider  amphetamine-dextroamphetamine (ADDERALL XR) 20 MG 24 hr capsule Take 20 mg by mouth every evening.  08/16/14   [provider]  amphetamine-dextroamphetamine (ADDERALL XR) 25 MG 24 hr capsule Take 25 mg by mouth every morning.  08/16/14   [provider]  b complex vitamins tablet Take 1 tablet by mouth daily.    [provider]  buPROPion (WELLBUTRIN XL) 300 MG 24 hr tablet Take 300 mg by mouth daily.    [provider]  levonorgestrel (MIRENA) 20 MCG/24HR IUD 1 each by Intrauterine route once.    [provider]  Multiple Vitamin (MULTIVITAMIN WITH MINERALS) TABS tablet Take 1 tablet by mouth daily.    [provider]  ondansetron (ZOFRAN ODT) 4 MG disintegrating tablet 4mg  ODT q4 hours prn nausea/vomit 10/22/14   Almond LintByerly, Faera, MD  oxyCODONE-acetaminophen (PERCOCET) 5-325 MG per tablet Take 1-2 tablets by mouth every 4 (four) hours as needed for severe pain. 10/22/14   Almond LintByerly, Faera, MD  valsartan-hydrochlorothiazide (DIOVAN-HCT) 160-12.5 MG per tablet Take 1 tablet by mouth daily.  06/10/14   [provider]    Family History No family history on file.  Social History Social History   Tobacco Use  . Smoking status: Current Every Day Smoker  . Smokeless tobacco: Never Used  Substance Use Topics  . Alcohol use: Yes  . Drug use: No     Allergies   Patient has no known allergies.   Review of Systems Review of Systems  Psychiatric/Behavioral: Positive for hallucinations.       Depression, paranoia  All other systems reviewed and are negative.    Physical  Exam Updated Vital Signs BP (!) 149/94 (BP Location: Right Arm)   Pulse (!) 51   Temp 98.7 F (37.1 C) (Oral)   Resp 16   SpO2 100%   Physical Exam  Constitutional: She is oriented to person, place, and time. She appears well-developed and well-nourished.  HENT:  Head: Normocephalic and atraumatic.  Mouth/Throat: Oropharynx is clear and moist.  Eyes: Pupils are equal, round, and reactive to light. Conjunctivae and EOM are normal.  Neck: Normal range of motion.  Cardiovascular: Normal rate, regular rhythm and normal heart sounds.  Pulmonary/Chest: Effort normal and breath sounds normal.  Abdominal: Soft. Bowel sounds are normal.  Musculoskeletal: Normal range of motion.  Neurological: She is alert and oriented to person, place, and time.  Skin: Skin is warm and dry.  Psychiatric: She exhibits a depressed mood.  Appears depressed, flat affect, able to maintain eye contact but seems emotionally absent Denies SI/HI  Nursing note and vitals reviewed.    ED Treatments / Results  Labs (all labs ordered are listed, but only abnormal results are displayed) Labs Reviewed  CBC - Abnormal; Notable for the following components:      Result Value   Hemoglobin 11.9 (*)    All other components within normal limits  BASIC METABOLIC PANEL  RAPID URINE DRUG SCREEN, HOSP PERFORMED  ETHANOL  I-STAT BETA HCG BLOOD, ED ([facility], [facility], [facility] ONLY)    EKG None  Radiology No results found.  Procedures Procedures (including critical care time)  Medications Ordered in ED Medications - No data to display   Initial Impression / Assessment and Plan / ED Course  I have reviewed the triage vital signs and the nursing notes.  Pertinent labs & imaging results that were available during my care of the patient were reviewed by me and considered in my medical decision making (see chart for details).  42 year old female presenting to the ED for psychiatric evaluation.  Recently stopped her depression  medications and has had somewhat of a downward trend over the past 2 weeks.  She denies any suicidal homicidal ideation.  She does report some hallucinations, mostly at night--reports hearing voices which prevent her from sleeping or will wake her from sleep.  Denies any hallucinations during the daytime.  She denies any illicit drug or alcohol abuse.  Screening labs are overall reassuring.  TTS has evaluated, does not feel she meets criteria for inpatient placement at this time.  She has been given resources by her former counselor but has not yet to establish care with a psychiatrist.  She was able to contract for safety and recommended that she follow-up as an outpatient closely.  She was given list of resources from ED for local facilities as well.  She understands to return here for any new or worsening symptoms, specifically development of any suicidal or homicidal  ideation or if she feels unsafe.  Final Clinical Impressions(s) / ED Diagnoses   Final diagnoses:  Depression, unspecified depression type    ED Discharge Orders    None       Garlon Hatchet, PA-C 03/30/18 0206    Terrilee Files, MD 03/30/18 1128

## 2018-03-29 NOTE — BH Assessment (Signed)
Pt is not in room and unable to be assessed by TTS.   Harlin RainFord Ellis Patsy BaltimoreWarrick Jr, LPC, [facility], [facility] Triage Specialist [phone]

## 2018-03-29 NOTE — ED Triage Notes (Signed)
Patient to ED with family c/o being "emotionally and mentally tired." Patient endorses history of depression and anxiety and states she has been under increased stress recently. She states that when she gets like this, she feels a tingling sensation in her forehead (sts has been going on for months). Patient denies SI/HI. She has a flat affect and speaks very quietly. Her family reports patient has had frequent mood swings, paranoia, and intermittent disorientation. Patient currently A&O x 4.

## 2018-03-30 NOTE — Discharge Instructions (Signed)
Please follow-up with one of the outpatient centers.  Return here for any new/acute changes.

## 2018-03-30 NOTE — BH Assessment (Signed)
Tele Assessment Note   Patient Name: Faith Wells MRN: [id number] Referring Physician: Parke Simmers Location of Patient: [facility] Location of Provider: Behavioral Health TTS Department  [name] is an 42 y.o. female who presented in the ED with what she describes as a panic attack.  Patient states that she has been depressed her whole life.  Patient states that her PCP was treating her depression with Fluoxetine, but she stopped taking it about a month ago. Patient states that she lets little thoughts occupy her mind and they continue to build there until she has an anxiety attack.  Patient states that there is nothing specific going on in her life other than being unemployed and struggling some financially that should be causing these attacks.  Patient states that she felt overwhelmed in May and had suicidal thoughts, but did not act on them.  Patient denies current SI/HI, but states that she sees things and hears non-discript voices at times, but she knows that they are not real. Patient states that she does have some sleep disturbance only sleeping 3 hours per night and states that she has not been eating well and has lost 30 lbs in the past few months.  When asked if she feels like she needs to be in a psych hospital, patient states, "I hope not."  Patient presented as alert and oriented, her memory was intact and her thoughts were organized.  Patient had good eye contact and her speech was clear.  Patient stated that she felt anxious and appeared to be depressed.  Her affect was flat.  Her judgment and impulse control as well as her insight appear to be good.  She did not appear to be responding to any internal stimuli.  Patient stated that she had sought services for herself at [facility] and was given a list of providers, but had not called any of them.  Patient was encouraged to find a provider, get back on her medications as soon as possible.  Patient is able to contract for safety to  be discharged.  Diagnosis:F33.3 MDD Recurrent Severe with Psychotic Features  Past Medical History:  Past Medical History:  Diagnosis Date  . Anxiety   . Asthma   . Depression   . GERD (gastroesophageal reflux disease)   . Hypertension     Past Surgical History:  Procedure Laterality Date  . CESAREAN SECTION    . CHOLECYSTECTOMY N/A 10/22/2014   Procedure: LAPAROSCOPIC CHOLECYSTECTOMY WITH INTRAOPERATIVE CHOLANGIOGRAM;  Surgeon: Almond Lint, MD;  Location: [facility] OR;  Service: General;  Laterality: N/A;  . HEMORRHOID SURGERY    . RECTAL POLYPECTOMY      Family History: No family history on file.  Social History:  reports that she has been smoking. She has never used smokeless tobacco. She reports that she drinks alcohol. She reports that she does not use drugs.  Additional Social History:  Alcohol / Drug Use Pain Medications: denies Prescriptions: denies Over the Counter: denies History of alcohol / drug use?: No history of alcohol / drug abuse  CIWA: CIWA-Ar BP: (!) 149/94 Pulse Rate: (!) 51 COWS:    Allergies: No Known Allergies  Home Medications:  (Not in a hospital admission)  OB/GYN Status:  No LMP recorded.  General Assessment Data Assessment unable to be completed: Yes Reason for not completing assessment: Pt is not in a room and unable to be assessed. Location of Assessment: [facility] ED TTS Assessment: In system Is this a Tele or Face-to-Face Assessment?: Tele  Assessment Is this an Initial Assessment or a Re-assessment for this encounter?: Initial Assessment Patient Accompanied by:: N/A Language Other than English: No Living Arrangements: Other (Comment)(lives in own apartment) What gender do you identify as?: Female Marital status: (not assessed) Pregnancy Status: No Living Arrangements: Children Can pt return to current living arrangement?: Yes Admission Status: Voluntary Is patient capable of signing voluntary admission?: Yes Referral Source:  Self/Family/Friend Insurance type: Biomedical scientist)     Crisis Care Plan Living Arrangements: Children Legal Guardian: Other:(self) Name of Psychiatrist: (none) Name of Therapist: (none)  Education Status Is patient currently in school?: No Is the patient employed, unemployed or receiving disability?: Unemployed  Risk to self with the past 6 months Suicidal Ideation: No Has patient been a risk to self within the past 6 months prior to admission? : Yes(thoughts of suicide in May) Suicidal Intent: No Has patient had any suicidal intent within the past 6 months prior to admission? : No Is patient at risk for suicide?: Yes Suicidal Plan?: No Has patient had any suicidal plan within the past 6 months prior to admission? : No Access to Means: No What has been your use of drugs/alcohol within the last 12 months?: (none) Previous Attempts/Gestures: No How many times?: (0) Other Self Harm Risks: (unemployed and financial problems) Triggers for Past Attempts: None known Intentional Self Injurious Behavior: None Family Suicide History: No Recent stressful life event(s): Job Loss, Financial Problems Persecutory voices/beliefs?: No Depression: Yes Depression Symptoms: Despondent, Insomnia, Isolating, Loss of interest in usual pleasures, Feeling worthless/self pity Substance abuse history and/or treatment for substance abuse?: No Suicide prevention information given to non-admitted patients: Not applicable  Risk to Others within the past 6 months Homicidal Ideation: No Does patient have any lifetime risk of violence toward others beyond the six months prior to admission? : No Thoughts of Harm to Others: No Current Homicidal Intent: No Current Homicidal Plan: No Access to Homicidal Means: No Identified Victim: none History of harm to others?: No Assessment of Violence: None Noted Violent Behavior Description: none Does patient have access to weapons?: No Criminal Charges Pending?:  No Does patient have a court date: No Is patient on probation?: No  Psychosis Hallucinations: Auditory, Visual(hears non-decript voices / sees things out the side of eye) Delusions: None noted  Mental Status Report Appearance/Hygiene: Unremarkable Eye Contact: Good Motor Activity: Freedom of movement Speech: Logical/coherent Level of Consciousness: Alert Mood: Depressed, Anxious Affect: Flat Anxiety Level: Severe Thought Processes: Coherent, Relevant Judgement: Unimpaired Orientation: Person, Place, Time, Situation Obsessive Compulsive Thoughts/Behaviors: Minimal  Cognitive Functioning Concentration: Decreased Memory: Recent Intact, Remote Intact Is patient IDD: No Insight: Fair Impulse Control: Good Appetite: Poor Have you had any weight changes? : Loss Amount of the weight change? (lbs): (wt loss of 30 lbs) Sleep: Decreased Total Hours of Sleep: 3  ADLScreening [facility] Assessment Services) Patient's cognitive ability adequate to safely complete daily activities?: Yes Patient able to express need for assistance with ADLs?: Yes Independently performs ADLs?: Yes (appropriate for developmental age)  Prior Inpatient Therapy Prior Inpatient Therapy: No  Prior Outpatient Therapy Prior Outpatient Therapy: Yes Prior Therapy Dates: active Prior Therapy Facilty/Provider(s): [facility] Reason for Treatment: depression Does patient have an ACCT team?: No Does patient have Intensive In-House Services?  : No Does patient have [facility] services? : No Does patient have P4CC services?: No  ADL Screening (condition at time of admission) Patient's cognitive ability adequate to safely complete daily activities?: Yes Is the patient deaf or have difficulty hearing?:  No Does the patient have difficulty seeing, even when wearing glasses/contacts?: No Does the patient have difficulty concentrating, remembering, or making decisions?: No Patient able to express need for assistance  with ADLs?: Yes Does the patient have difficulty dressing or bathing?: No Independently performs ADLs?: Yes (appropriate for developmental age) Does the patient have difficulty walking or climbing stairs?: No Weakness of Legs: None Weakness of Arms/Hands: None  Home Assistive Devices/Equipment Home Assistive Devices/Equipment: None  Therapy Consults (therapy consults require a physician order) PT Evaluation Needed: No OT Evalulation Needed: No SLP Evaluation Needed: No Abuse/Neglect Assessment (Assessment to be complete while patient is alone) Abuse/Neglect Assessment Can Be Completed: Yes Physical Abuse: Denies Verbal Abuse: Denies Exploitation of patient/patient's resources: Denies Self-Neglect: Denies Values / Beliefs Cultural Requests During Hospitalization: None Spiritual Requests During Hospitalization: None Consults Spiritual Care Consult Needed: No Social Work Consult Needed: No Merchant navy officerAdvance Directives (For Healthcare) Does Patient Have a Medical Advance Directive?: No Would patient like information on creating a medical advance directive?: No - Patient declined Nutrition Screen- [facility] Has the patient recently lost weight without trying?: Yes, 24-33 lbs. Has the patient been eating poorly because of a decreased appetite?: Yes Malnutrition Screening Tool Score: 4        Disposition: Per Donell SievertSpencer Simon, NP, Patient does not meet inpatient admission criteria and can be discharged from the ED with outpatient referrals Disposition Initial Assessment Completed for this Encounter: Yes Disposition of Patient: Discharge Patient refused recommended treatment: No Mode of transportation if patient is discharged?: Car Patient referred to: Outpatient clinic referral  This service was provided via telemedicine using a 2-way, interactive audio and video technology.  Names of all persons participating in this telemedicine service and their role in this encounter. Name:  Joselyn GlassmanMarquila Broz Role: patient  Name: Dannielle Huhanny Merel Santoli Role: TTS  Name:  Role:   Name:  Role:     Arnoldo LenisDanny J Reet Scharrer 03/30/2018 12:30 AM

## 2018-03-30 NOTE — BH Assessment (Signed)
[facility] Assessment Progress Note    Per Donell SievertSpencer Simon, NP, Patient does not meet inpatient admission criteria and can be discharged from the ED with outpatient referrals

## 2018-05-02 ENCOUNTER — Ambulatory Visit
Admission: RE | Admit: 2018-05-02 | Discharge: 2018-05-02 | Disposition: A | Discharge status: Home / Self Care | Payer: Medicaid Other | Source: Ambulatory Visit | Attending: Family Medicine | Admitting: Family Medicine

## 2018-05-02 ENCOUNTER — Other Ambulatory Visit: Payer: Self-pay | Admitting: Family Medicine

## 2018-05-02 DIAGNOSIS — M25571 Pain in right ankle and joints of right foot: Principal | ICD-10-CM

## 2018-05-02 DIAGNOSIS — M25471 Effusion, right ankle: Secondary | ICD-10-CM

## 2018-06-11 ENCOUNTER — Emergency Department (HOSPITAL_COMMUNITY): Payer: Medicaid Other

## 2018-06-11 ENCOUNTER — Emergency Department (HOSPITAL_COMMUNITY)
Admission: EM | Admit: 2018-06-11 | Discharge: 2018-06-12 | Disposition: A | Discharge status: Other Acute Inpatient Hospital | Payer: Medicaid Other | Attending: Emergency Medicine | Admitting: Emergency Medicine

## 2018-06-11 ENCOUNTER — Encounter (HOSPITAL_COMMUNITY): Payer: Self-pay

## 2018-06-11 DIAGNOSIS — Z79899 Other long term (current) drug therapy: Secondary | ICD-10-CM | POA: Insufficient documentation

## 2018-06-11 DIAGNOSIS — J45909 Unspecified asthma, uncomplicated: Secondary | ICD-10-CM | POA: Diagnosis not present

## 2018-06-11 DIAGNOSIS — R443 Hallucinations, unspecified: Secondary | ICD-10-CM | POA: Insufficient documentation

## 2018-06-11 DIAGNOSIS — F17210 Nicotine dependence, cigarettes, uncomplicated: Secondary | ICD-10-CM | POA: Diagnosis not present

## 2018-06-11 DIAGNOSIS — F333 Major depressive disorder, recurrent, severe with psychotic symptoms: Secondary | ICD-10-CM | POA: Diagnosis not present

## 2018-06-11 DIAGNOSIS — I10 Essential (primary) hypertension: Secondary | ICD-10-CM | POA: Diagnosis not present

## 2018-06-11 DIAGNOSIS — R45851 Suicidal ideations: Secondary | ICD-10-CM

## 2018-06-11 LAB — RAPID URINE DRUG SCREEN, HOSP PERFORMED
AMPHETAMINES: POSITIVE — AB
Barbiturates: NOT DETECTED
Benzodiazepines: NOT DETECTED
COCAINE: NOT DETECTED
Opiates: NOT DETECTED
TETRAHYDROCANNABINOL: NOT DETECTED

## 2018-06-11 LAB — COMPREHENSIVE METABOLIC PANEL
ALT: 28 U/L (ref 0–44)
AST: 22 U/L (ref 15–41)
Albumin: 4.2 g/dL (ref 3.5–5.0)
Alkaline Phosphatase: 63 U/L (ref 38–126)
Anion gap: 8 (ref 5–15)
BUN: 5 mg/dL — ABNORMAL LOW (ref 6–20)
CO2: 23 mmol/L (ref 22–32)
Calcium: 10 mg/dL (ref 8.9–10.3)
Chloride: 106 mmol/L (ref 98–111)
Creatinine, Ser: 0.8 mg/dL (ref 0.44–1.00)
GFR calc Af Amer: >60 mL/min (ref >60)
GFR calc non Af Amer: >60 mL/min (ref >60)
Glucose, Bld: 87 mg/dL (ref 70–99)
Potassium: 3.9 mmol/L (ref 3.5–5.1)
Sodium: 137 mmol/L (ref 135–145)
Total Bilirubin: 0.9 mg/dL (ref 0.3–1.2)
Total Protein: 7.3 g/dL (ref 6.5–8.1)

## 2018-06-11 LAB — CBC
HCT: 40.4 % (ref 36.0–46.0)
Hemoglobin: 12.7 g/dL (ref 12.0–15.0)
MCH: 28.6 pg (ref 26.0–34.0)
MCHC: 31.4 g/dL (ref 30.0–36.0)
MCV: 91 fL (ref 80.0–100.0)
Platelets: 292 10*3/uL (ref 150–400)
RBC: 4.44 MIL/uL (ref 3.87–5.11)
RDW: 13.8 % (ref 11.5–15.5)
WBC: 7.4 10*3/uL (ref 4.0–10.5)
nRBC: 0 % (ref 0.0–0.2)

## 2018-06-11 LAB — I-STAT BETA HCG BLOOD, ED (MC, WL, AP ONLY): I-stat hCG, quantitative: <5 m[IU]/mL (ref <5)

## 2018-06-11 LAB — ACETAMINOPHEN LEVEL: Acetaminophen (Tylenol), Serum: <10 ug/mL — ABNORMAL LOW (ref 10–30)

## 2018-06-11 LAB — SALICYLATE LEVEL: Salicylate Lvl: <7 mg/dL (ref 2.8–30.0)

## 2018-06-11 LAB — ETHANOL: Alcohol, Ethyl (B): <10 mg/dL (ref <10)

## 2018-06-11 MED ORDER — VENLAFAXINE HCL ER 37.5 MG PO CP24
37.5000 mg | ORAL_CAPSULE | Freq: Every day | ORAL | Status: DC
  Administered 2018-06-11: 37.5 mg via ORAL
  Filled 2018-06-11: qty 1

## 2018-06-11 MED ORDER — HYDROCHLOROTHIAZIDE 12.5 MG PO CAPS
12.5000 mg | ORAL_CAPSULE | Freq: Every day | ORAL | Status: DC
  Administered 2018-06-11: 12.5 mg via ORAL
  Filled 2018-06-11: qty 1

## 2018-06-11 MED ORDER — AMLODIPINE BESYLATE 5 MG PO TABS
5.0000 mg | ORAL_TABLET | Freq: Every day | ORAL | Status: DC
  Administered 2018-06-11: 5 mg via ORAL
  Filled 2018-06-11: qty 1

## 2018-06-11 MED ORDER — IRBESARTAN 150 MG PO TABS
150.0000 mg | ORAL_TABLET | Freq: Every day | ORAL | Status: DC
  Administered 2018-06-11: 150 mg via ORAL
  Filled 2018-06-11: qty 1

## 2018-06-11 MED ORDER — AMPHETAMINE-DEXTROAMPHET ER 20 MG PO CP24: 20.0000 mg | ORAL_CAPSULE | Freq: Every day | ORAL | Status: DC

## 2018-06-11 MED ORDER — VALSARTAN-HYDROCHLOROTHIAZIDE 160-12.5 MG PO TABS: 1.0000 | ORAL_TABLET | Freq: Every day | ORAL | Status: DC

## 2018-06-11 NOTE — BH Assessment (Addendum)
Tele Assessment Note   Patient Name: Faith Wells MRN: [id number] Referring Physician: Adela Wells [facility] Location of Provider: Behavioral Health TTS Department  Faith MartinezMarquila N Wells is an 42 y.o. female.  The pt came in due increasing depression and hearing a voice telling her to jump.  The pt stated she heard a voice last night telling her to jump.  She has been hearing voices for the past year.  She denies hearing voices in her 1820's.  The pt denies current SI, but stated she had suicidal thoughts in the past.  She is stressed about being unemployed, finances, hearing voices, and not having transportation.  The pt hasn't been eating and has lost 30 pounds in the past 2 months.  The pt isn't getting out of the bed.  She sees a Veterinary surgeoncounselor by the name of Faith Wells, who works with the [employer] [facility].  She isn't seeing a psychiatrist, but takes Fluoxetine and Adderall.  She denies any past suicide attempts.  The pt currently lives with a friend.  She denies self harm, HI, legal issues and history of abuse.  The pt stated she sleeps 8 hours, but it is an interrupted sleep.  She reports feeling hopeless, tired most of the time and feeling bad about herself.  The pt denies SA.   Pt is dressed in casual clothes. She is alert and oriented x4. Pt speaks in a clear tone, at a barely audible volume and slow pace. Eye contact is fair. Pt's mood is depressed. Thought process is coherent and relevant. There is no indication Pt is currently responding to internal stimuli or experiencing delusional thought content.?Pt was cooperative throughout assessment.    Diagnosis: F33.3 Major depressive disorder, Recurrent episode, With psychotic features  Past Medical History:  Past Medical History:  Diagnosis Date  . Anxiety   . Asthma   . Depression   . GERD (gastroesophageal reflux disease)   . Hypertension     Past Surgical History:  Procedure Laterality Date  . CESAREAN SECTION    . CHOLECYSTECTOMY  N/A 10/22/2014   Procedure: LAPAROSCOPIC CHOLECYSTECTOMY WITH INTRAOPERATIVE CHOLANGIOGRAM;  Surgeon: Faith LintFaera Byerly, MD;  Location: [facility] OR;  Service: General;  Laterality: N/A;  . HEMORRHOID SURGERY    . RECTAL POLYPECTOMY      Family History: No family history on file.  Social History:  reports that she has been smoking. She has never used smokeless tobacco. She reports that she drinks alcohol. She reports that she does not use drugs.  Additional Social History:  Alcohol / Drug Use Pain Medications: See MAR Prescriptions: See MAR Over the Counter: See MAR History of alcohol / drug use?: No history of alcohol / drug abuse Longest period of sobriety (when/how long): NA  CIWA: CIWA-Ar BP: (!) 158/99 Pulse Rate: 63 COWS:    Allergies: No Known Allergies  Home Medications:  (Not in a hospital admission)  OB/GYN Status:  Patient's last menstrual period was 06/08/2018 (approximate).  General Assessment Data [facility] Assessment: In system Is this a Tele or Face-to-Face Assessment?: Face-to-Face Is this an Initial Assessment or a Re-assessment for this encounter?: Initial Assessment Patient Accompanied by:: Parent Language Other than English: No Living Arrangements: Other (Comment)(home) What gender do you identify as?: Female Marital status: Single Maiden name: Faith Wells Pregnancy Status: No Living Arrangements: Non-relatives/Friends Can pt return to current living arrangement?: Yes Admission Status: Voluntary Is patient capable of signing voluntary admission?: Yes Referral Source: Self/Family/Friend Insurance type:  Medicaid     Crisis Care Plan Living Arrangements: Non-relatives/Friends Legal Guardian: Other:(self) Name of Psychiatrist: none Name of Therapist: Brianna with [facility]  Education Status Is patient currently in school?: No Is the patient employed, unemployed or receiving disability?: Unemployed  Risk to self with the past 6  months Suicidal Ideation: No-Not Currently/Within Last 6 Months Has patient been a risk to self within the past 6 months prior to admission? : No Suicidal Intent: No Has patient had any suicidal intent within the past 6 months prior to admission? : No Is patient at risk for suicide?: Yes Suicidal Plan?: No Has patient had any suicidal plan within the past 6 months prior to admission? : No Access to Means: No What has been your use of drugs/alcohol within the last 12 months?: none Previous Attempts/Gestures: No How many times?: 0 Other Self Harm Risks: none Triggers for Past Attempts: None known Intentional Self Injurious Behavior: None Family Suicide History: No Recent stressful life event(s): Job Loss, Financial Problems Persecutory voices/beliefs?: No Depression: Yes Depression Symptoms: Despondent, Isolating, Loss of interest in usual pleasures, Feeling worthless/self pity Substance abuse history and/or treatment for substance abuse?: No Suicide prevention information given to non-admitted patients: Yes  Risk to Others within the past 6 months Homicidal Ideation: No Does patient have any lifetime risk of violence toward others beyond the six months prior to admission? : No Thoughts of Harm to Others: No Current Homicidal Intent: No Current Homicidal Plan: No Access to Homicidal Means: No Identified Victim: none History of harm to others?: No Assessment of Violence: None Noted Violent Behavior Description: none Does patient have access to weapons?: No Criminal Charges Pending?: No Does patient have a court date: No Is patient on probation?: No  Psychosis Hallucinations: Auditory Delusions: None noted  Mental Status Report Appearance/Hygiene: Unremarkable Eye Contact: Good Motor Activity: Freedom of movement, Unremarkable Speech: Logical/coherent, Soft, Slow Level of Consciousness: Quiet/awake Mood: Depressed, Helpless Affect: Flat Anxiety Level: None Thought  Processes: Coherent, Relevant Judgement: Impaired Orientation: Person, Place, Time, Situation Obsessive Compulsive Thoughts/Behaviors: None  Cognitive Functioning Concentration: Normal Memory: Recent Intact, Remote Intact Is patient IDD: No Insight: Poor Impulse Control: Poor Appetite: Poor Have you had any weight changes? : Loss Amount of the weight change? (lbs): 30 lbs(in the past 2 months) Sleep: No Change Total Hours of Sleep: 8 Vegetative Symptoms: None  ADLScreening [facility] Assessment Services) Patient's cognitive ability adequate to safely complete daily activities?: Yes Patient able to express need for assistance with ADLs?: Yes Independently performs ADLs?: Yes (appropriate for developmental age)  Prior Inpatient Therapy Prior Inpatient Therapy: No  Prior Outpatient Therapy Prior Outpatient Therapy: Yes Prior Therapy Dates: current Prior Therapy Facilty/Provider(s): Brianna Reason for Treatment: depression Does patient have an ACCT team?: No Does patient have Intensive In-House Services?  : No Does patient have [facility] services? : No Does patient have P4CC services?: No  ADL Screening (condition at time of admission) Patient's cognitive ability adequate to safely complete daily activities?: Yes Patient able to express need for assistance with ADLs?: Yes Independently performs ADLs?: Yes (appropriate for developmental age)       Abuse/Neglect Assessment (Assessment to be complete while patient is alone) Abuse/Neglect Assessment Can Be Completed: Yes Physical Abuse: Denies Verbal Abuse: Denies Sexual Abuse: Denies Exploitation of patient/patient's resources: Denies Self-Neglect: Denies Values / Beliefs Cultural Requests During Hospitalization: None Spiritual Requests During Hospitalization: None Consults Spiritual Care Consult Needed: No Social Work Consult Needed: No  Disposition:  Disposition Initial Assessment Completed for this  Encounter: Yes   NP Kayren Eaves recommends inpatient treatment.  RN Seward Grater was made aware of the disposition.  This service was provided via telemedicine using a 2-way, interactive audio and video technology.  Names of all persons participating in this telemedicine service and their role in this encounter. Name: Desteny Freeman Role: Pt  Name: Riley Churches Role: TTS  Name:  Role:   Name:  Role:     Ottis Stain 06/11/2018 2:23 PM

## 2018-06-11 NOTE — ED Triage Notes (Signed)
Pt presents for evaluation of auditory hallucinations. Reports she was at group meeting today and told counselor today that one voice told her last night to "jump." Pt denies SI/HI. States has been taking medication as prescribed.

## 2018-06-11 NOTE — Progress Notes (Signed)
[facility]; room 403-1 [name], NP is the accepting provider.   Dr. Jama Flavorsobos is the attending provider.   Call report to [phone]   [facility] notified.    Pt is voluntary and can be transported by [facility].  [facility]  [name], LCSW, LCAS Disposition CSW [facility] [phone]

## 2018-06-11 NOTE — ED Provider Notes (Signed)
[facility] Provider Note   CSN: [id number] Arrival date & time: 06/11/18  1314     History   Chief Complaint Chief Complaint  Patient presents with  . Hallucinations    HPI Faith Wells is a 42 y.o. female.  42 yo F with a cc of auditory hallucinations.  This been going on for the past couple days.  Patient is concerned so came here for evaluation.  Denies ideation.  On review of systems she said that she was somewhat short of breath when she is been having headaches off and on.  The history is provided by the patient.  Illness  This is a new problem. The current episode started more than 2 days ago. The problem occurs constantly. The problem has been gradually worsening. Associated symptoms include headaches and shortness of breath. Pertinent negatives include no chest pain and no abdominal pain. Nothing aggravates the symptoms. Nothing relieves the symptoms. She has tried nothing for the symptoms. The treatment provided no relief.    Past Medical History:  Diagnosis Date  . Anxiety   . Asthma   . Depression   . GERD (gastroesophageal reflux disease)   . Hypertension     There are no active problems to display for this patient.   Past Surgical History:  Procedure Laterality Date  . CESAREAN SECTION    . CHOLECYSTECTOMY N/A 10/22/2014   Procedure: LAPAROSCOPIC CHOLECYSTECTOMY WITH INTRAOPERATIVE CHOLANGIOGRAM;  Surgeon: Almond LintFaera Byerly, MD;  Location: [facility] OR;  Service: General;  Laterality: N/A;  . HEMORRHOID SURGERY    . RECTAL POLYPECTOMY       OB History   None      Home Medications    Prior to Admission medications   Medication Sig Start Date End Date Taking? Authorizing Provider  ADDERALL XR 20 MG 24 hr capsule Take 20 mg by mouth daily. 06/01/18  Yes [provider]  amLODipine (NORVASC) 5 MG tablet Take 5 mg by mouth daily. 04/30/18  Yes [provider]  b complex vitamins tablet Take 1 tablet  by mouth daily.   Yes [provider]  Lactobacillus (BIOTINEX PO) Take 1 tablet by mouth daily.   Yes [provider]  levonorgestrel (MIRENA) 20 MCG/24HR IUD 1 each by Intrauterine route once.   Yes [provider]  Multiple Vitamin (MULTIVITAMIN WITH MINERALS) TABS tablet Take 1 tablet by mouth daily.   Yes [provider]  valsartan-hydrochlorothiazide (DIOVAN-HCT) 160-12.5 MG per tablet Take 1 tablet by mouth daily.  06/10/14  Yes [provider]  venlafaxine XR (EFFEXOR-XR) 37.5 MG 24 hr capsule Take 37.5 mg by mouth at bedtime. 05/15/18  Yes [provider]  oxyCODONE-acetaminophen (PERCOCET) 5-325 MG per tablet Take 1-2 tablets by mouth every 4 (four) hours as needed for severe pain. Patient not taking: Reported on 03/29/2018 10/22/14   Almond LintByerly, Faera, MD    Family History No family history on file.  Social History Social History   Tobacco Use  . Smoking status: Current Every Day Smoker  . Smokeless tobacco: Never Used  Substance Use Topics  . Alcohol use: Yes  . Drug use: No     Allergies   Patient has no known allergies.   Review of Systems Review of Systems  Constitutional: Negative for chills and fever.  HENT: Negative for congestion and rhinorrhea.   Eyes: Negative for redness and visual disturbance.  Respiratory: Positive for shortness of breath. Negative for wheezing.   Cardiovascular: Negative  for chest pain and palpitations.  Gastrointestinal: Negative for abdominal pain, nausea and vomiting.  Genitourinary: Negative for dysuria and urgency.  Musculoskeletal: Negative for arthralgias and myalgias.  Skin: Negative for pallor and wound.  Neurological: Positive for headaches. Negative for dizziness.     Physical Exam Updated Vital Signs BP (!) 146/84 (BP Location: Left Arm)   Pulse 79   Temp 98.3 F (36.8 C) (Oral)   Resp 18   Ht 5\' 10"  (1.778 m)   Wt 70.8 kg   LMP 05/18/2018   SpO2 100%   BMI 22.38  kg/m   Physical Exam  Constitutional: She is oriented to person, place, and time. She appears well-developed and well-nourished. No distress.  HENT:  Head: Normocephalic and atraumatic.  Eyes: Pupils are equal, round, and reactive to light. EOM are normal.  Neck: Normal range of motion. Neck supple.  Cardiovascular: Normal rate and regular rhythm. Exam reveals no gallop and no friction rub.  No murmur heard. Pulmonary/Chest: Effort normal. She has no wheezes. She has no rales.  Abdominal: Soft. She exhibits no distension. There is no tenderness.  Musculoskeletal: She exhibits no edema or tenderness.  Neurological: She is alert and oriented to person, place, and time.  Skin: Skin is warm and dry. She is not diaphoretic.  Psychiatric: Her behavior is normal. Her affect is blunt. She exhibits a depressed mood.  Nursing note and vitals reviewed.    ED Treatments / Results  Labs (all labs ordered are listed, but only abnormal results are displayed) Labs Reviewed  COMPREHENSIVE METABOLIC PANEL - Abnormal; Notable for the following components:      Result Value   BUN 5 (*)    All other components within normal limits  ACETAMINOPHEN LEVEL - Abnormal; Notable for the following components:   Acetaminophen (Tylenol), Serum <10 (*)    All other components within normal limits  RAPID URINE DRUG SCREEN, HOSP PERFORMED - Abnormal; Notable for the following components:   Amphetamines POSITIVE (*)    All other components within normal limits  ETHANOL  SALICYLATE LEVEL  CBC  I-STAT BETA HCG BLOOD, ED ([facility], [facility], [facility] ONLY)    EKG None  Radiology Dg Chest 2 View  Result Date: 06/11/2018 CLINICAL DATA:  Chronic shortness of breath. History of asthma. Current smoker. EXAM: CHEST - 2 VIEW COMPARISON:  None. FINDINGS: The lungs are well-expanded. There is no focal infiltrate. The interstitial markings are mildly prominent. There is no pleural effusion. The heart and pulmonary vascularity are  normal. The mediastinum is normal in width. The bony thorax exhibits no acute abnormality. IMPRESSION: Mild interstitial prominence consistent with reactive airway disease and the patient's smoking history. There is no pneumonia nor other acute cardiopulmonary abnormality. Electronically Signed   By: David  [place] M.D.   On: 06/11/2018 16:59    Procedures Procedures (including critical care time)  Medications Ordered in ED Medications  amLODipine (NORVASC) tablet 5 mg (has no administration in time range)  venlafaxine XR (EFFEXOR-XR) 24 hr capsule 37.5 mg (has no administration in time range)  irbesartan (AVAPRO) tablet 150 mg (has no administration in time range)    And  hydrochlorothiazide (MICROZIDE) capsule 12.5 mg (has no administration in time range)  amphetamine-dextroamphetamine (ADDERALL XR) 24 hr capsule 20 mg (has no administration in time range)     Initial Impression / Assessment and Plan / ED Course  I have reviewed the triage vital signs and the nursing notes.  Pertinent labs & imaging results that were  available during my care of the patient were reviewed by me and considered in my medical decision making (see chart for details).     42 yo F with a chief complaint of depression and hallucinations.  Going on for the past couple days.  Became concerned so came for evaluation today.  She is having shortness of breath though on exam the patient is in no extremis is taking so even breaths clear lung sounds on my exam.  Also complaining of a mild headache.  No noted neurologic deficits will obtain a chest x-ray. TTS eval. I feel the patient is medically clear.  I received a call from TTS stating that the patient was psychiatrically clear and ready for discharge.  Went to discuss this with the family and they became concerned, felt that the auditory hallucinations that are telling the patient to harm herself it got worse and that they were afraid that she was going to act on them.   They did not feel that the patient was safe to be discharged home.  Will reconsult TTS for evaluation.  I discussed the case with TTS, there was confusion and who they actually want to be discharged and they do not intend on this patient to be discharged home consider looking for inpatient treatment.   The patients results and plan were reviewed and discussed.   Any x-rays performed were independently reviewed by myself.   Differential diagnosis were considered with the presenting HPI.  Medications  amLODipine (NORVASC) tablet 5 mg (has no administration in time range)  venlafaxine XR (EFFEXOR-XR) 24 hr capsule 37.5 mg (has no administration in time range)  irbesartan (AVAPRO) tablet 150 mg (has no administration in time range)    And  hydrochlorothiazide (MICROZIDE) capsule 12.5 mg (has no administration in time range)  amphetamine-dextroamphetamine (ADDERALL XR) 24 hr capsule 20 mg (has no administration in time range)    Vitals:   06/11/18 1320 06/11/18 1754  BP: (!) 158/99 (!) 146/84  Pulse: 63 79  Resp: 18 18  Temp: 98.9 F (37.2 C) 98.3 F (36.8 C)  TempSrc:  Oral  SpO2: 100% 100%  Weight: 70.8 kg   Height: 5\' 10"  (1.778 m)     Final diagnoses:  Hallucinations  Suicidal ideation       Final Clinical Impressions(s) / ED Diagnoses   Final diagnoses:  Hallucinations  Suicidal ideation    ED Discharge Orders    None       Melene Plan, DO 06/11/18 1854

## 2018-06-11 NOTE — ED Notes (Signed)
Pt and her mom made a list of her concerns and what she feels she needs help with.  1. I'm tired of phasing in and out. 2. The wiggly, crawling sensations in my forehead. 3. Hearing voices as I'm going to sleep and sometimes during the day. 4. Some days not feeling like doing anything. 5. Involuntary tics (head, neck, legs) sometimes at night. 6. Short-term memory loss.

## 2018-06-12 ENCOUNTER — Encounter (HOSPITAL_COMMUNITY): Payer: Self-pay

## 2018-06-12 ENCOUNTER — Inpatient Hospital Stay (HOSPITAL_COMMUNITY)
Admission: AD | Admit: 2018-06-12 | Discharge: 2018-06-20 | DRG: 885 | Disposition: A | Discharge status: Home / Self Care | Payer: Medicaid Other | Source: Intra-hospital | Attending: Psychiatry | Admitting: Psychiatry

## 2018-06-12 ENCOUNTER — Other Ambulatory Visit: Payer: Self-pay

## 2018-06-12 DIAGNOSIS — Z975 Presence of (intrauterine) contraceptive device: Secondary | ICD-10-CM | POA: Diagnosis not present

## 2018-06-12 DIAGNOSIS — I10 Essential (primary) hypertension: Secondary | ICD-10-CM | POA: Diagnosis present

## 2018-06-12 DIAGNOSIS — F332 Major depressive disorder, recurrent severe without psychotic features: Secondary | ICD-10-CM | POA: Diagnosis not present

## 2018-06-12 DIAGNOSIS — F17210 Nicotine dependence, cigarettes, uncomplicated: Secondary | ICD-10-CM | POA: Diagnosis present

## 2018-06-12 DIAGNOSIS — Z86010 Personal history of colonic polyps: Secondary | ICD-10-CM

## 2018-06-12 DIAGNOSIS — Z9049 Acquired absence of other specified parts of digestive tract: Secondary | ICD-10-CM | POA: Diagnosis not present

## 2018-06-12 DIAGNOSIS — F323 Major depressive disorder, single episode, severe with psychotic features: Principal | ICD-10-CM | POA: Diagnosis present

## 2018-06-12 DIAGNOSIS — R45851 Suicidal ideations: Secondary | ICD-10-CM | POA: Diagnosis present

## 2018-06-12 DIAGNOSIS — Z8249 Family history of ischemic heart disease and other diseases of the circulatory system: Secondary | ICD-10-CM | POA: Diagnosis not present

## 2018-06-12 DIAGNOSIS — F419 Anxiety disorder, unspecified: Secondary | ICD-10-CM | POA: Diagnosis present

## 2018-06-12 DIAGNOSIS — G4700 Insomnia, unspecified: Secondary | ICD-10-CM | POA: Diagnosis present

## 2018-06-12 DIAGNOSIS — F600 Paranoid personality disorder: Secondary | ICD-10-CM | POA: Diagnosis present

## 2018-06-12 DIAGNOSIS — K219 Gastro-esophageal reflux disease without esophagitis: Secondary | ICD-10-CM | POA: Diagnosis present

## 2018-06-12 DIAGNOSIS — F909 Attention-deficit hyperactivity disorder, unspecified type: Secondary | ICD-10-CM | POA: Diagnosis present

## 2018-06-12 LAB — LIPID PANEL
CHOLESTEROL: 200 mg/dL (ref 0–200)
HDL: 68 mg/dL (ref >40)
LDL Cholesterol: 122 mg/dL — ABNORMAL HIGH (ref 0–99)
TRIGLYCERIDES: 48 mg/dL (ref <150)
Total CHOL/HDL Ratio: 2.9 RATIO
VLDL: 10 mg/dL (ref 0–40)

## 2018-06-12 LAB — HEMOGLOBIN A1C
HEMOGLOBIN A1C: 5 % (ref 4.8–5.6)
Mean Plasma Glucose: 96.8 mg/dL

## 2018-06-12 LAB — TSH: TSH: 3.73 u[IU]/mL (ref 0.350–4.500)

## 2018-06-12 MED ORDER — ENSURE ENLIVE PO LIQD
237.0000 mL | Freq: Two times a day (BID) | ORAL | Status: DC
  Administered 2018-06-12: 237 mL via ORAL

## 2018-06-12 MED ORDER — VENLAFAXINE HCL ER 37.5 MG PO CP24
37.5000 mg | ORAL_CAPSULE | Freq: Every day | ORAL | Status: DC
  Filled 2018-06-12 (×2): qty 1

## 2018-06-12 MED ORDER — MAGNESIUM HYDROXIDE 400 MG/5ML PO SUSP
30.0000 mL | Freq: Every day | ORAL | Status: DC | PRN
  Administered 2018-06-14: 30 mL via ORAL
  Filled 2018-06-12: qty 30

## 2018-06-12 MED ORDER — ALUM & MAG HYDROXIDE-SIMETH 200-200-20 MG/5ML PO SUSP: 30.0000 mL | ORAL | Status: DC | PRN

## 2018-06-12 MED ORDER — LORAZEPAM 0.5 MG PO TABS
0.5000 mg | ORAL_TABLET | Freq: Four times a day (QID) | ORAL | Status: DC | PRN
  Administered 2018-06-13: 0.5 mg via ORAL
  Filled 2018-06-12: qty 1

## 2018-06-12 MED ORDER — AMLODIPINE BESYLATE 5 MG PO TABS
5.0000 mg | ORAL_TABLET | Freq: Every day | ORAL | Status: DC
  Administered 2018-06-12 – 2018-06-15 (×4): 5 mg via ORAL
  Filled 2018-06-12 (×5): qty 1

## 2018-06-12 MED ORDER — ACETAMINOPHEN 325 MG PO TABS: 650.0000 mg | ORAL_TABLET | Freq: Four times a day (QID) | ORAL | Status: DC | PRN

## 2018-06-12 MED ORDER — PNEUMOCOCCAL VAC POLYVALENT 25 MCG/0.5ML IJ INJ: 0.5000 mL | INJECTION | INTRAMUSCULAR | Status: DC

## 2018-06-12 MED ORDER — ENSURE ENLIVE PO LIQD
237.0000 mL | ORAL | Status: DC
  Administered 2018-06-13 – 2018-06-20 (×8): 237 mL via ORAL

## 2018-06-12 MED ORDER — VENLAFAXINE HCL ER 75 MG PO CP24
75.0000 mg | ORAL_CAPSULE | Freq: Every day | ORAL | Status: DC
  Administered 2018-06-12 – 2018-06-13 (×2): 75 mg via ORAL
  Filled 2018-06-12 (×3): qty 1

## 2018-06-12 MED ORDER — TRAZODONE HCL 50 MG PO TABS: 50.0000 mg | ORAL_TABLET | Freq: Every evening | ORAL | Status: DC | PRN

## 2018-06-12 MED ORDER — HYDROCHLOROTHIAZIDE 12.5 MG PO CAPS
12.5000 mg | ORAL_CAPSULE | Freq: Every day | ORAL | Status: DC
  Administered 2018-06-12 – 2018-06-20 (×9): 12.5 mg via ORAL
  Filled 2018-06-12 (×10): qty 1

## 2018-06-12 MED ORDER — IRBESARTAN 150 MG PO TABS
150.0000 mg | ORAL_TABLET | Freq: Every day | ORAL | Status: DC
  Administered 2018-06-12 – 2018-06-20 (×9): 150 mg via ORAL
  Filled 2018-06-12 (×10): qty 1

## 2018-06-12 MED ORDER — HYDROXYZINE HCL 25 MG PO TABS: 25.0000 mg | ORAL_TABLET | Freq: Three times a day (TID) | ORAL | Status: DC | PRN

## 2018-06-12 MED ORDER — QUETIAPINE FUMARATE 50 MG PO TABS
50.0000 mg | ORAL_TABLET | Freq: Every day | ORAL | Status: DC
  Administered 2018-06-12 – 2018-06-13 (×2): 50 mg via ORAL
  Filled 2018-06-12 (×3): qty 1

## 2018-06-12 NOTE — Progress Notes (Signed)
Faith Wells is a 42 year old female being admitted voluntarily to 403-1 from [facility].  She came to the ED with increasing depression, hearing voice telling her to jump, poor sleep and appetite.  [facility] she was pleasant and cooperative.  She denied SI/HI and will seek out staff if that changes.  She continues to hear voices that talk down to her.  She denied visual hallucinations.  She denied any pain or discomfort and appears to be in no physical distress.  She reported her stressors as being unemployed, living with a friend and ongoing psychosis that started about a year ago.  Oriented her to the unit. Admission paperwork completed and signed.  Belongings searched and secured in locker # 25, no contraband found.  Skin assessment completed and no skin issues noted.  Q 15 minute checks initiated for safety.  We will continue to monitor the progress towards her goals.

## 2018-06-12 NOTE — [facility] Suicide Risk Assessment (Signed)
[facility] INPATIENT:  Family/Significant Other Suicide Prevention Education  Suicide Prevention Education:  Contact Attempts: Faith Wells, mother [phone]) has been identified by the patient as the family member/significant other with whom the patient will be residing, and identified as the person(s) who will aid the patient in the event of a mental health crisis.  With written consent from the patient, two attempts were made to provide suicide prevention education, prior to and/or following the patient's discharge.  We were unsuccessful in providing suicide prevention education.  A suicide education pamphlet was given to the patient to share with family/significant other.  Date and time of first attempt:06/12/18 /12:26pm   Faith Wells 06/12/2018, 12:26 PM

## 2018-06-12 NOTE — [facility] Group Notes (Signed)
The focus of this group is to help patients establish daily goals to achieve during treatment and discuss how the patient can incorporate goal setting into their daily lives to aide in recovery.  Pt did not attend 

## 2018-06-12 NOTE — Progress Notes (Signed)
NUTRITION ASSESSMENT  Pt identified as at risk on the Malnutrition Screen Tool  INTERVENTION: - Continue Ensure Enlive but will decrease to once/day, this supplement provides 350 kcal and 20 grams of protein. - Continue to encourage PO intakes.   NUTRITION DIAGNOSIS: Unintentional weight loss related to sub-optimal intake as evidenced by pt report.   Goal: Pt to meet >/= 90% of their estimated nutrition needs.  Monitor:  PO intake  Assessment:  Patient admitted for increasing depression, AHs, poor sleep, and poor appetite. Per chart review, current weight is 160 lb and no other recent weight hx available. The last recorded weight in the chart is from 02/24/15 when patient weighed 186 lb. This indicates 26 lb weight loss (14% body weight) in 3 years and 3 months; not significant for time frame.   42 y.o. female  Height: Ht Readings from Last 1 Encounters:  06/12/18 5\' 9"  (1.753 m)    Weight: Wt Readings from Last 1 Encounters:  06/12/18 72.6 kg    Weight Hx: Wt Readings from Last 10 Encounters:  06/12/18 72.6 kg  06/11/18 70.8 kg  02/24/15 84.4 kg  10/22/14 88.2 kg  10/09/14 88.2 kg  08/30/14 88.5 kg    BMI:  Body mass index is 23.63 kg/m. Pt meets criteria for normal weight based on current BMI.  Estimated Nutritional Needs: Kcal: 25-30 kcal/kg Protein: > 1 gram protein/kg Fluid: 1 ml/kcal  Diet Order:  Diet Order            Diet regular Room service appropriate? Yes; Fluid consistency: Thin  Diet effective now             Pt is also offered choice of unit snacks mid-morning and mid-afternoon.  Pt is eating as desired.   Lab results and medications reviewed.     Trenton GammonJessica Lecia Esperanza, MS, RD, LDN, [facility] Inpatient Clinical Dietitian Pager # [phone] After hours/weekend pager # [phone]

## 2018-06-12 NOTE — Plan of Care (Signed)
  Problem: Education: Goal: Emotional status will improve Outcome: Not Progressing Goal: Verbalization of understanding the information provided will improve Outcome: Not Progressing   Problem: Coping: Goal: Coping ability will improve Outcome: Not Progressing  D: Patient reports poor sleep.  She appears to be paranoid and exhibits thought blocking.  Her goal today is "to be less depressed."  She denies any thoughts of self harm today.  She rates her depression as a 7; hopelessness and anxiety as an 8.  Patient did not want to leave her room this morning.  She had to be coaxed out by staff.  Her movements are slow and deliberate.  She appears to be responding to internal stimuli.  Her affect is flat; blunted and her mood is sad is depressed.  A: Continue to monitor medication management and MD orders.  Safety checks completed every 15 minutes per protocol.  Offer support and encouragement as needed.  R: Patient is receptive to staff; her behavior is appropriate.

## 2018-06-12 NOTE — Progress Notes (Signed)
D: Pt was in dayroom upon initial approach.  Pt presents with preoccupied affect and depressed, preoccupied mood.  She describes her day as "better because there's more people like me that understand what I'm going through."  Her goal is to "sleep through the night."  Her eye contact is intense and pt is slow to answer some questions.  She laughs inappropriately at times.  Pt denies SI/HI, denies pain.  She endorses AH of "voices" and did not wish to elaborate further.  Pt has been visible in milieu interacting with peers and staff cautiously.    A: Introduced self to pt.  Met with pt 1:1.  Actively listened to pt and offered support and encouragement.  Medications administered per order.  Fall prevention techniques reviewed with pt and she verbalized understanding.  Q15 minute safety checks maintained.  R: Pt is safe on the unit.  Pt is compliant with medications.  Pt verbally contracts for safety.  Will continue to monitor and assess.

## 2018-06-12 NOTE — [facility] Suicide Risk Assessment (Signed)
[facility]   Nursing information obtained from:    Demographic factors:  Unemployed Current Mental Status:  Suicidal ideation indicated by patient Loss Factors:  Decrease in vocational status, Financial problems / change in socioeconomic status Historical Factors:  NA Risk Reduction Factors:  Living with another person, especially a relative  Total Time spent with patient: 45 minutes Principal Problem:  MDD with psychotic features  Diagnosis:  Active Problems:   Severe major depression with psychotic features, mood-congruent (HCC)  Subjective Data:   Continued Clinical Symptoms:  Alcohol Use Disorder Identification Test Final Score (AUDIT): 0 The "Alcohol Use Disorders Identification Test", Guidelines for Use in Primary Care, Second Edition.  World Science writerHealth Organization [facility](WHO). Score between 0-7:  no or low risk or alcohol related problems. Score between 8-15:  moderate risk of alcohol related problems. Score between 16-19:  high risk of alcohol related problems. Score 20 or above:  warrants further diagnostic evaluation for alcohol dependence and treatment.   CLINICAL FACTORS:  42 year old female, presented for worsening depression, neuro-vegetative symptoms of depression, passive SI, auditory hallucinations of a demeaning , insulting nature.      Psychiatric Specialty Exam: Physical Exam  ROS  Blood pressure 126/89, pulse 73, temperature 98 F (36.7 C), temperature source Oral, resp. rate 18, height 5\' 9"  (1.753 m), weight 72.6 kg, last menstrual period 05/18/2018.Body mass index is 23.63 kg/m.  See admit note MSE                                                        COGNITIVE FEATURES THAT CONTRIBUTE TO RISK:  Closed-mindedness and Loss of executive function    SUICIDE RISK:   Moderate:  Frequent suicidal ideation with limited intensity, and duration, some specificity in terms of plans, no associated intent, good  self-control, limited dysphoria/symptomatology, some risk factors present, and identifiable protective factors, including available and accessible social support.  PLAN OF CARE: Patient will be admitted to inpatient psychiatric unit for stabilization and safety. Will provide and encourage milieu participation. Provide medication management and maked adjustments as needed.  Will follow daily.    I certify that inpatient services furnished can reasonably be expected to improve the patient's condition.   Craige CottaFernando A Cobos, MD 06/12/2018, 12:51 PM

## 2018-06-12 NOTE — [facility] Suicide Risk Assessment (Addendum)
[facility] INPATIENT:  Family/Significant Other Suicide Prevention Education  Suicide Prevention Education:  Education Completed; Briant CedarLaurann Abello, mother [phone]) has been identified by the patient as the family member/significant other with whom the patient will be residing, and identified as the person(s) who will aid the patient in the event of a mental health crisis (suicidal ideations/suicide attempt).  With written consent from the patient, the family member/significant other has been provided the following suicide prevention education, prior to the and/or following the discharge of the patient.  The suicide prevention education provided includes the following:  Suicide risk factors  Suicide prevention and interventions  National Suicide Hotline telephone number  [facility] assessment telephone number  [facility] Assistance 911  [facility] and/or Residential Mobile Crisis Unit telephone number  Request made of family/significant other to:  Remove weapons (e.g., guns, rifles, knives), all items previously/currently identified as safety concern.    Remove drugs/medications (over-the-counter, prescriptions, illicit drugs), all items previously/currently identified as a safety concern.  The family member/significant other verbalizes understanding of the suicide prevention education information provided.  The family member/significant other agrees to remove the items of safety concern listed above.  Patient's mother states that she has noticed a difference in her daughter's behavior since she lost her job back in march 2019. Patient's mother also reports that the patient has dealt with depression since middle school. She also shared that the patient has struggled with depression, paranoia, delusional thinking for months. Patient's mother reports she believes the patient's strained relationship with her son and his father contributes to her current state.  Patient's mother reports the patient also struggles with short term memory loss.     Maeola SarahJolan E Shalae Belmonte 06/12/2018, 12:28 PM

## 2018-06-12 NOTE — Tx Team (Signed)
Interdisciplinary Treatment and Diagnostic Plan Update  06/12/2018 Time of Session:  Faith Wells MRN: [id number]  Principal Diagnosis: <principal problem not specified>  Secondary Diagnoses: Active Problems:   Severe major depression with psychotic features, mood-congruent (HCC)   Current Medications:  Current Facility-Administered Medications  Medication Dose Route Frequency Provider Last Rate Last Dose  . acetaminophen (TYLENOL) tablet 650 mg  650 mg Oral Q6H PRN Rozetta Nunnery, NP      . alum & mag hydroxide-simeth (MAALOX/MYLANTA) 200-200-20 MG/5ML suspension 30 mL  30 mL Oral Q4H PRN Lindon Romp A, NP      . amLODipine (NORVASC) tablet 5 mg  5 mg Oral Daily Lindon Romp A, NP      . feeding supplement (ENSURE ENLIVE) (ENSURE ENLIVE) liquid 237 mL  237 mL Oral BID BM Cobos, Myer Peer, MD      . irbesartan (AVAPRO) tablet 150 mg  150 mg Oral Daily Lindon Romp A, NP       And  . hydrochlorothiazide (MICROZIDE) capsule 12.5 mg  12.5 mg Oral Daily Lindon Romp A, NP      . hydrOXYzine (ATARAX/VISTARIL) tablet 25 mg  25 mg Oral TID PRN Rozetta Nunnery, NP      . magnesium hydroxide (MILK OF MAGNESIA) suspension 30 mL  30 mL Oral Daily PRN Rozetta Nunnery, NP      . Derrill Memo ON 06/13/2018] pneumococcal 23 valent vaccine (PNU-IMMUNE) injection 0.5 mL  0.5 mL Intramuscular Tomorrow-1000 Cobos, Myer Peer, MD      . traZODone (DESYREL) tablet 50 mg  50 mg Oral QHS PRN Lindon Romp A, NP      . venlafaxine XR (EFFEXOR-XR) 24 hr capsule 37.5 mg  37.5 mg Oral QHS Lindon Romp A, NP       PTA Medications: Medications Prior to Admission  Medication Sig Dispense Refill Last Dose  . ADDERALL XR 20 MG 24 hr capsule Take 20 mg by mouth daily.  0 06/10/2018 at Unknown time  . amLODipine (NORVASC) 5 MG tablet Take 5 mg by mouth daily.  1 06/10/2018 at Unknown time  . b complex vitamins tablet Take 1 tablet by mouth daily.   06/11/2018 at Unknown time  . Lactobacillus (BIOTINEX PO) Take 1  tablet by mouth daily.   06/11/2018 at Unknown time  . levonorgestrel (MIRENA) 20 MCG/24HR IUD 1 each by Intrauterine route once.   06/11/2018 at Unknown time  . Multiple Vitamin (MULTIVITAMIN WITH MINERALS) TABS tablet Take 1 tablet by mouth daily.   06/11/2018 at Unknown time  . oxyCODONE-acetaminophen (PERCOCET) 5-325 MG per tablet Take 1-2 tablets by mouth every 4 (four) hours as needed for severe pain. (Patient not taking: Reported on 03/29/2018) 40 tablet 0 Not Taking at Unknown time  . valsartan-hydrochlorothiazide (DIOVAN-HCT) 160-12.5 MG per tablet Take 1 tablet by mouth daily.   1 Past Week at Unknown time  . venlafaxine XR (EFFEXOR-XR) 37.5 MG 24 hr capsule Take 37.5 mg by mouth at bedtime.  0 06/10/2018 at Unknown time    Patient Stressors: Financial difficulties Occupational concerns  Patient Strengths: Capable of independent living Curator fund of knowledge Motivation for treatment/growth  Treatment Modalities: Medication Management, Group therapy, Case management,  1 to 1 session with clinician, Psychoeducation, Recreational therapy.   Physician Treatment Plan for Primary Diagnosis: <principal problem not specified> Long Term Goal(s):     Short Term Goals:    Medication Management: Evaluate patient's response, side effects, and tolerance of medication regimen.  Therapeutic Interventions: 1 to 1 sessions, Unit Group sessions and Medication administration.  Evaluation of Outcomes: Not Met  Physician Treatment Plan for Secondary Diagnosis: Active Problems:   Severe major depression with psychotic features, mood-congruent ([place])  Long Term Goal(s):     Short Term Goals:       Medication Management: Evaluate patient's response, side effects, and tolerance of medication regimen.  Therapeutic Interventions: 1 to 1 sessions, Unit Group sessions and Medication administration.  Evaluation of Outcomes: Not Met   RN Treatment Plan for Primary  Diagnosis: <principal problem not specified> Long Term Goal(s): Knowledge of disease and therapeutic regimen to maintain health will improve  Short Term Goals: Ability to participate in decision making will improve, Ability to verbalize feelings will improve, Ability to disclose and discuss suicidal ideas and Ability to identify and develop effective coping behaviors will improve  Medication Management: RN will administer medications as ordered by provider, will assess and evaluate patient's response and provide education to patient for prescribed medication. RN will report any adverse and/or side effects to prescribing provider.  Therapeutic Interventions: 1 on 1 counseling sessions, Psychoeducation, Medication administration, Evaluate responses to treatment, Monitor vital signs and CBGs as ordered, Perform/monitor CIWA, COWS, AIMS and Fall Risk screenings as ordered, Perform wound care treatments as ordered.  Evaluation of Outcomes: Not Met   LCSW Treatment Plan for Primary Diagnosis: <principal problem not specified> Long Term Goal(s): Safe transition to appropriate next level of care at discharge, Engage patient in therapeutic group addressing interpersonal concerns.  Short Term Goals: Engage patient in aftercare planning with referrals and resources  Therapeutic Interventions: Assess for all discharge needs, 1 to 1 time with Social worker, Explore available resources and support systems, Assess for adequacy in community support network, Educate family and significant other(s) on suicide prevention, Complete Psychosocial Assessment, Interpersonal group therapy.  Evaluation of Outcomes: Not Met   Progress in Treatment: Attending groups: No. Participating in groups: No. Taking medication as prescribed: Yes. Toleration medication: Yes. Family/Significant other contact made: No, will contact:  if patient consents to collateral contacts Patient understands diagnosis: Yes. Discussing  patient identified problems/goals with staff: Yes. Medical problems stabilized or resolved: Yes. Denies suicidal/homicidal ideation: Yes. Issues/concerns per patient self-inventory: No. Other:   New problem(s) identified: None   New Short Term/Long Term Goal(s): medication stabilization, elimination of SI thoughts, development of comprehensive mental wellness plan.    Patient Goals:  To get better and learn how to cope with anxiety, get rid of the voices and to get rid of the wiggly, crawling sensations in my forehead.    Discharge Plan or Barriers: CSW will assess for appropriate referrals and possible discharge planning.   Reason for Continuation of Hospitalization: Anxiety Depression Medication stabilization Suicidal ideation  Estimated Length of Stay: 3-5 days   Attendees: Patient: 06/12/2018 8:12 AM  Physician: Dr. Neita Garnet, MD 06/12/2018 8:12 AM  Nursing: Chrys Racer.Jacinto Reap, RN 06/12/2018 8:12 AM  RN Care Manager: 06/12/2018 8:12 AM  Social Worker: Radonna Ricker, [place] 06/12/2018 8:12 AM  Recreational Therapist:  06/12/2018 8:12 AM  Other:  06/12/2018 8:12 AM  Other:  06/12/2018 8:12 AM  Other: 06/12/2018 8:12 AM    Scribe for Treatment Team: Marylee Floras, [place] 06/12/2018 8:12 AM

## 2018-06-12 NOTE — ED Notes (Signed)
Pt alert and oriented in NAD. Pt and her belongings [facility] with [facility] [place] [facility]
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
## 2018-06-12 NOTE — H&P (Addendum)
Psychiatric Admission Assessment Adult  Patient Identification: Faith MartinezMarquila N Wells MRN:  [id number] Date of Evaluation:  06/12/2018 Chief Complaint: " I am hearing voices, I get a crawling sensation in my head and face, depression" Principal Diagnosis: MDD with psychotic features  Diagnosis:  Active Problems:   Severe major depression with psychotic features, mood-congruent (HCC)  History of Present Illness: 42 year old female, presented to ED voluntarily , at the encouragement of her therapist . Reports she has been experiencing  auditory hallucinations for several months. Describes them as insulting ,states " they tell me I am nothing, a trifling, a ho ". States she recently heard voice telling her to " jump" but denies other command hallucinations. She also endorses significant depression and states depression predated hallucinations. States she has been depressed " for quite some time". Describes neuro-vegetative symptoms of depression as below. Endorses some passive SI, thinking about dying and whether " anyone would miss me", but denies suicidal plans or intention.  Describes unemployment and financial concerns as factors contributing to depression.  Associated Signs/Symptoms: Depression Symptoms:  depressed mood, anhedonia, insomnia, suicidal thoughts without plan, anxiety, loss of energy/fatigue, decreased appetite, reports she has lost 40 lbs this year (Hypo) Manic Symptoms:  Reports some irritability which she attributes to insomnia Anxiety Symptoms:  Reports increased anxiety/worry Psychotic Symptoms:  As above, endorses auditory hallucinations . Does not currently present internally preoccupied PTSD Symptoms: Does not endorse  Total Time spent with patient: 45 minutes  Past Psychiatric History: no prior psychiatric admissions, reports history of suicide attempt by overdosing on ASA as a young teenager, denies history of self cutting .  Describes chronic depression, and  reports auditory hallucinations for several weeks to months, in the context of depression. Denies history of mania or hypomania. Reports she is prescribed Adderall for ADHD.   Is the patient at risk to self? Yes.    Has the patient been a risk to self in the past 6 months? Yes.    Has the patient been a risk to self within the distant past? No.  Is the patient a risk to others? No.  Has the patient been a risk to others in the past 6 months? No.  Has the patient been a risk to others within the distant past? No.   Prior Inpatient Therapy:  no prior psychiatric admissions  Prior Outpatient Therapy:  psychiatric medications currently managed by PCP, Dr. Parke SimmersBland , in [place]. She has an outpatient therapist, who she started seeing recently.   Alcohol Screening: 1. How often do you have a drink containing alcohol?: Never 2. How many drinks containing alcohol do you have on a typical day when you are drinking?: 1 or 2 3. How often do you have six or more drinks on one occasion?: Never AUDIT-C Score: 0 9. Have you or someone else been injured as a result of your drinking?: No 10. Has a relative or friend or a doctor or another health worker been concerned about your drinking or suggested you cut down?: No Alcohol Use Disorder Identification Test Final Score (AUDIT): 0 Intervention/Follow-up: AUDIT Score <7 follow-up not indicated Substance Abuse History in the last 12 months:  Denies any drug or alcohol abuse  Consequences of Substance Abuse: Denies  Previous Psychotropic Medications: Adderall XR 20 mgrs QDAY, states has been taking x 2 years, Effexor XR 37.5 mgrs QDAY, started about a month ago. In the past had been on Cymbalta, feels it worked well for her.   Psychological Evaluations:  No  Past Medical History: HTN. NKDA Past Medical History:  Diagnosis Date  . Anxiety   . Asthma   . Depression   . GERD (gastroesophageal reflux disease)   . Hypertension     Past Surgical History:   Procedure Laterality Date  . CESAREAN SECTION    . CHOLECYSTECTOMY N/A 10/22/2014   Procedure: LAPAROSCOPIC CHOLECYSTECTOMY WITH INTRAOPERATIVE CHOLANGIOGRAM;  Surgeon: Almond Lint, MD;  Location: [facility] OR;  Service: General;  Laterality: N/A;  . HEMORRHOID SURGERY    . RECTAL POLYPECTOMY     Family History: father died from MI in 11-26-10, mother is alive, has one brother Family Psychiatric  History: brother has history of ADHD, anxiety. No suicides in family. Denies alcohol or drug abuse in family Tobacco Screening: Smokes 1/2 PPD Social History: 31, single,has a 37 year old son, currently with his father, lives with a roommate, unemployed . Social History   Substance and Sexual Activity  Alcohol Use Yes     Social History   Substance and Sexual Activity  Drug Use No    Additional Social History: Marital status: Single Are you sexually active?: No What is your sexual orientation?: Heterosexual  Has your sexual activity been affected by drugs, alcohol, medication, or emotional stress?: No  Does patient have children?: Yes How many children?: 1 How is patient's relationship with their children?: Patient reports having a strained and distant relationship with her son due his relationship with his father   Allergies:  No Known Allergies Lab Results:  Results for orders placed or performed during the hospital encounter of 06/12/18 (from the past 48 hour(s))  Hemoglobin A1c     Status: None   Collection Time: 06/12/18  6:43 AM  Result Value Ref Range   Hgb A1c MFr Bld 5.0 4.8 - 5.6 %    Comment: (NOTE) Pre diabetes:          5.7%-6.4% Diabetes:              >6.4% Glycemic control for   <7.0% adults with diabetes    Mean Plasma Glucose 96.8 mg/dL    Comment: Performed at [facility], [address], [place], [place] [address]  Lipid panel     Status: Abnormal   Collection Time: 06/12/18  6:43 AM  Result Value Ref Range   Cholesterol 200 0 - 200 mg/dL   Triglycerides 48  <604 mg/dL   HDL 68 >54 mg/dL   Total CHOL/HDL Ratio 2.9 RATIO   VLDL 10 0 - 40 mg/dL   LDL Cholesterol 098 (H) 0 - 99 mg/dL    Comment:        Total Cholesterol/HDL:CHD Risk Coronary Heart Disease Risk Table                     Men   Women  1/2 Average Risk   3.4   3.3  Average Risk       5.0   4.4  2 X Average Risk   9.6   7.1  3 X Average Risk  23.4   11.0        Use the calculated Patient Ratio above and the CHD Risk Table to determine the patient's CHD Risk.        ATP III CLASSIFICATION (LDL):  <100     mg/dL   Optimal  119-147  mg/dL   Near or Above  Optimal  130-159  mg/dL   Borderline  409-811  mg/dL   High  >914     mg/dL   Very High Performed at [facility], [address] [place], [place] [address]   TSH     Status: None   Collection Time: 06/12/18  6:43 AM  Result Value Ref Range   TSH 3.730 0.350 - 4.500 uIU/mL    Comment: Performed by a 3rd Generation assay with a functional sensitivity of <=0.01 uIU/mL. Performed at [facility], [address] [place], [place] [address]     Blood Alcohol level:  Lab Results  Component Value Date   ETH <10 06/11/2018   ETH <10 03/29/2018    Metabolic Disorder Labs:  Lab Results  Component Value Date   HGBA1C 5.0 06/12/2018   MPG 96.8 06/12/2018   No results found for: PROLACTIN Lab Results  Component Value Date   CHOL 200 06/12/2018   TRIG 48 06/12/2018   HDL 68 06/12/2018   CHOLHDL 2.9 06/12/2018   VLDL 10 06/12/2018   LDLCALC 122 (H) 06/12/2018    Current Medications: Current Facility-Administered Medications  Medication Dose Route Frequency Provider Last Rate Last Dose  . acetaminophen (TYLENOL) tablet 650 mg  650 mg Oral Q6H PRN Nira Conn A, NP      . alum & mag hydroxide-simeth (MAALOX/MYLANTA) 200-200-20 MG/5ML suspension 30 mL  30 mL Oral Q4H PRN Nira Conn A, NP      . amLODipine (NORVASC) tablet 5 mg  5 mg Oral Daily Nira Conn  A, NP   5 mg at 06/12/18 0815  . feeding supplement (ENSURE ENLIVE) (ENSURE ENLIVE) liquid 237 mL  237 mL Oral Q24H Malcolm Hetz, Rockey Situ, MD      . irbesartan (AVAPRO) tablet 150 mg  150 mg Oral Daily Nira Conn A, NP   150 mg at 06/12/18 0815   And  . hydrochlorothiazide (MICROZIDE) capsule 12.5 mg  12.5 mg Oral Daily Nira Conn A, NP   12.5 mg at 06/12/18 0815  . hydrOXYzine (ATARAX/VISTARIL) tablet 25 mg  25 mg Oral TID PRN Jackelyn Poling, NP      . magnesium hydroxide (MILK OF MAGNESIA) suspension 30 mL  30 mL Oral Daily PRN Jackelyn Poling, NP      . Melene Muller ON 06/13/2018] pneumococcal 23 valent vaccine (PNU-IMMUNE) injection 0.5 mL  0.5 mL Intramuscular Tomorrow-1000 Corianne Buccellato, Rockey Situ, MD      . traZODone (DESYREL) tablet 50 mg  50 mg Oral QHS PRN Nira Conn A, NP      . venlafaxine XR (EFFEXOR-XR) 24 hr capsule 37.5 mg  37.5 mg Oral QHS Nira Conn A, NP       PTA Medications: Medications Prior to Admission  Medication Sig Dispense Refill Last Dose  . ADDERALL XR 20 MG 24 hr capsule Take 20 mg by mouth daily.  0 06/10/2018 at Unknown time  . amLODipine (NORVASC) 5 MG tablet Take 5 mg by mouth daily.  1 06/10/2018 at Unknown time  . b complex vitamins tablet Take 1 tablet by mouth daily.   06/11/2018 at Unknown time  . Lactobacillus (BIOTINEX PO) Take 1 tablet by mouth daily.   06/11/2018 at Unknown time  . levonorgestrel (MIRENA) 20 MCG/24HR IUD 1 each by Intrauterine route once.   06/11/2018 at Unknown time  . Multiple Vitamin (MULTIVITAMIN WITH MINERALS) TABS tablet Take 1 tablet by mouth daily.   06/11/2018 at Unknown time  . oxyCODONE-acetaminophen (  PERCOCET) 5-325 MG per tablet Take 1-2 tablets by mouth every 4 (four) hours as needed for severe pain. (Patient not taking: Reported on 03/29/2018) 40 tablet 0 Not Taking at Unknown time  . valsartan-hydrochlorothiazide (DIOVAN-HCT) 160-12.5 MG per tablet Take 1 tablet by mouth daily.   1 Past Week at Unknown time  . venlafaxine XR  (EFFEXOR-XR) 37.5 MG 24 hr capsule Take 37.5 mg by mouth at bedtime.  0 06/10/2018 at Unknown time    Musculoskeletal: Strength & Muscle Tone: within normal limits Gait & Station: normal Patient leans: N/A  Psychiatric Specialty Exam: Physical Exam  Review of Systems  Constitutional: Positive for weight loss. Negative for chills and fever.  Eyes: Negative.   Respiratory: Negative.   Cardiovascular: Negative.   Gastrointestinal: Negative for diarrhea, nausea and vomiting.  Genitourinary: Negative.   Musculoskeletal: Negative.   Skin: Negative.   Neurological: Negative for seizures and headaches.  Endo/Heme/Allergies: Negative.   Psychiatric/Behavioral: Positive for depression, hallucinations and suicidal ideas.  All other systems reviewed and are negative.   Blood pressure 126/89, pulse 73, temperature 98 F (36.7 C), temperature source Oral, resp. rate 18, height 5\' 9"  (1.753 m), weight 72.6 kg, last menstrual period 05/18/2018.Body mass index is 23.63 kg/m.  General Appearance: Fairly Groomed  Eye Contact:  Fair  Speech:  Normal Rate  Volume:  Decreased  Mood:  Depressed presents vaguely depressed, constricted, states that today feeling " a little better"  Affect:  constricted and vaguely anxious   Thought Process:  Linear and Descriptions of Associations: Intact  Orientation:  Other:  fully alert and attentive   Thought Content:  reports auditory hallucinations as above, does not currently present internally preoccupied, no delusions are expressed   Suicidal Thoughts:  No currently denies suicidal or self injurious ideations, denies homicidal or violent ideations, contracts for safety on unit   Homicidal Thoughts:  No  Memory:  recent and remote grossly intact   Judgement:  Fair  Insight:  Fair  Psychomotor Activity:  Decreased  Concentration:  Concentration: Good and Attention Span: Good  Recall:  Good  Fund of Knowledge:  Good  Language:  Good  Akathisia:  Negative   Handed:  Right  AIMS (if indicated):     Assets:  Desire for Improvement Resilience  ADL's:  Intact  Cognition:  WNL  Sleep:  Number of Hours: 2    Treatment Plan Summary: Daily contact with patient to assess and evaluate symptoms and progress in treatment, Medication management, Plan inpatient treatment and medications as below  Observation Level/Precautions:  15 minute checks  Laboratory:  as needed   Psychotherapy:  Milieu, group therapy  Medications: At this time would not restart Adderall as could be a contributor to psychosis, anxiety, weight loss. We discussed antidepressant options- currently on low dose Effexor XR . Increase to 75 mgrs QDAY . Start Seroquel 50 mgrs QHS. Ativan PRNs for anxiety. Continue Norvasc/Diovan for HTN  Consultations: as needed     Discharge Concerns:  -   Estimated LOS: 5 days   Other:     Physician Treatment Plan for Primary Diagnosis: MDD with psychotic features  Long Term Goal(s): Improvement in symptoms so as ready for discharge  Short Term Goals: Ability to identify changes in lifestyle to reduce recurrence of condition will improve, Ability to verbalize feelings will improve, Ability to disclose and discuss suicidal ideas, Ability to demonstrate self-control will improve, Ability to identify and develop effective coping behaviors will improve and Ability to maintain  clinical measurements within normal limits will improve  Physician Treatment Plan for Secondary Diagnosis: Active Problems:   Severe major depression with psychotic features, mood-congruent (HCC)  Long Term Goal(s): Improvement in symptoms so as ready for discharge  Short Term Goals: Ability to identify changes in lifestyle to reduce recurrence of condition will improve and Ability to maintain clinical measurements within normal limits will improve  I certify that inpatient services furnished can reasonably be expected to improve the patient's condition.    Craige Cotta,  MD 11/27/201912:07 PM

## 2018-06-12 NOTE — ED Notes (Signed)
Contacted third shift for [facility] transport as instructed by second shift. ETA 1:15 am

## 2018-06-12 NOTE — [facility] Group Notes (Signed)
[facility] Group Therapy      06/12/2018 11:16 AM  Type of Therapy: [employer] Presentation  Participation Level: Active  Participation Quality: Attentive  Affect: Appropriate  Cognitive: Oriented  Insight: Developing/Improving  Engagement in Therapy: Engaged  Modes of Intervention: Discussion, Education and Socialization  Summary of Progress/Problems: [employer] ([facility]) Speaker came to talk about his personal journey with mental health. The pt processed ways by which to relate to the speaker. MHA speaker provided handouts and educational information pertaining to groups and services offered by the MHA. Pt was engaged in speaker's presentation and was receptive to resources provided.    Abenezer Odonell LCSWA Clinical Social Worker   

## 2018-06-12 NOTE — Tx Team (Signed)
Initial Treatment Plan 06/12/2018 2:04 AM Faith MartinezMarquila N Soh ZOX:[id number]    PATIENT STRESSORS: Financial difficulties Occupational concerns   PATIENT STRENGTHS: Capable of independent living Wellsite geologistCommunication skills General fund of knowledge Motivation for treatment/growth   PATIENT IDENTIFIED PROBLEMS: Depression  Psychosis  "To get better and learn how to cope with anxiety"  "to get rid of the voices"  "To get rid of the wiggly, crawling sensations in my forehead"             DISCHARGE CRITERIA:  Improved stabilization in mood, thinking, and/or behavior Need for constant or close observation no longer present Reduction of life-threatening or endangering symptoms to within safe limits Verbal commitment to aftercare and medication compliance  PRELIMINARY DISCHARGE PLAN: Outpatient therapy Medication management  PATIENT/FAMILY INVOLVEMENT: This treatment plan has been presented to and reviewed with the patient, Faith Wells.  The patient and family have been given the opportunity to ask questions and make suggestions.  Levin BaconHeather V Dea Bitting, RN 06/12/2018, 2:04 AM

## 2018-06-12 NOTE — Progress Notes (Signed)
Recreation Therapy Notes  Date: 11.27.19 Time: 930 Location: [address] Dayroom  Group Topic: Stress Management  Goal Area(s) Addresses:  Patient will verbalize importance of using healthy stress management.  Patient will identify positive emotions associated with healthy stress management.   Intervention: Stress Management  Activity :  Meditation.  LRT introduced the stress management technique of meditation.  LRT played a meditation that guided patients to focus on the present and let go of things they can't change.  Patients were to follow along as the meditation played.  Education:  Stress Management, Discharge Planning.   Education Outcome: Acknowledges edcuation/In group clarification offered/Needs additional education  Clinical Observations/Feedback: Pt did not attend group.     Lorrine Killilea, LRT/CTRS         Illiana Losurdo A 06/12/2018 11:05 AM 

## 2018-06-13 DIAGNOSIS — F332 Major depressive disorder, recurrent severe without psychotic features: Secondary | ICD-10-CM

## 2018-06-13 NOTE — Progress Notes (Signed)
Patient stated she has taken adderall 20 mg since 04/30/2018 and would like it to continue.

## 2018-06-13 NOTE — Progress Notes (Signed)
D: Pt was in dayroom with visitor upon initial approach.  Pt presents with preoccupied affect and depressed, preoccupied mood.  Pt reports she felt "groggy" today.  Her goal was to "stay out of my room, not to isolate myself" and pt reports she met goal.  She reports she had a good visit with her mother.  Pt stares blankly at times when she is asked a question and sometimes requires multiple prompts before answering.  Pt denies SI/HI, denies pain.  She reports AH of "voices."  Pt has been visible in milieu interacting with peers and staff cautiously.  Pt attended evening group.    A: Met with pt 1:1.  Actively listened to pt and offered support and encouragement. Medications administered per order. Q15 minute safety checks maintained.  R: Pt is safe on the unit.  Pt is compliant with medications.  Pt verbally contracts for safety.  Will continue to monitor and assess.

## 2018-06-13 NOTE — Progress Notes (Signed)
[facility]  06/13/2018 9:27 AM Faith MartinezMarquila N Wells  MRN:  [id number]   Subjective: Patient reports today that she is having some minor improvements.  She does report having some drowsiness this morning after taking the Seroquel last night.  She reports that she is still having some minor auditory hallucinations but states that she feels they are starting to improve some.  She denies any suicidal or homicidal ideations and denies any visual hallucinations.  She reports that she was taking the Effexor XR 75 mg daily prior to being admitted and was taken in the nighttime because she felt it made her sleepy.  She is asking about when she should take the medication.  Patient reports that she thinks that things are slightly improving but she could use some more time and continue the current medications.  Patient states that she does not want to change her medications at this time and wants to continue them at the current doses however.  Objective: Patient's chart and findings reviewed and discussed with treatment team.  Patient presents in the day room and is pleasant, calm, and cooperative.  Patient does present with a flat affect and not very talkative today.  She answers questions quickly into the point.  She does look off when she is talking and does not make significant eye contact during the interview.  Principal Problem: Severe major depression with psychotic features, mood-congruent (HCC) Diagnosis: Principal Problem:   Severe major depression with psychotic features, mood-congruent (HCC)  Total Time spent with patient: 20 minutes  Past Psychiatric History: See H&P  Past Medical History:  Past Medical History:  Diagnosis Date  . Anxiety   . Asthma   . Depression   . GERD (gastroesophageal reflux disease)   . Hypertension     Past Surgical History:  Procedure Laterality Date  . CESAREAN SECTION    . CHOLECYSTECTOMY N/A 10/22/2014   Procedure: LAPAROSCOPIC CHOLECYSTECTOMY WITH  INTRAOPERATIVE CHOLANGIOGRAM;  Surgeon: Almond LintFaera Byerly, MD;  Location: [facility] OR;  Service: General;  Laterality: N/A;  . HEMORRHOID SURGERY    . RECTAL POLYPECTOMY     Family History: History reviewed. No pertinent family history. Family Psychiatric  History: See H&P Social History:  Social History   Substance and Sexual Activity  Alcohol Use Yes     Social History   Substance and Sexual Activity  Drug Use No    Social History   Socioeconomic History  . Marital status: Single    Spouse name: Not on file  . Number of children: Not on file  . Years of education: Not on file  . Highest education level: Not on file  Occupational History  . Not on file  Social Needs  . Financial resource strain: Not on file  . Food insecurity:    Worry: Not on file    Inability: Not on file  . Transportation needs:    Medical: Not on file    Non-medical: Not on file  Tobacco Use  . Smoking status: Current Every Day Smoker  . Smokeless tobacco: Never Used  Substance and Sexual Activity  . Alcohol use: Yes  . Drug use: No  . Sexual activity: Yes  Lifestyle  . Physical activity:    Days per week: Not on file    Minutes per session: Not on file  . Stress: Not on file  Relationships  . Social connections:    Talks on phone: Not on file    Gets together: Not on file  Attends religious service: Not on file    Active member of club or organization: Not on file    Attends meetings of clubs or organizations: Not on file    Relationship status: Not on file  Other Topics Concern  . Not on file  Social History Narrative  . Not on file   Additional Social History:                         Sleep: Good  Appetite:  Good  Current Medications: Current Facility-Administered Medications  Medication Dose Route Frequency Provider Last Rate Last Dose  . acetaminophen (TYLENOL) tablet 650 mg  650 mg Oral Q6H PRN Nira Conn A, NP      . alum & mag hydroxide-simeth (MAALOX/MYLANTA)  200-200-20 MG/5ML suspension 30 mL  30 mL Oral Q4H PRN Nira Conn A, NP      . amLODipine (NORVASC) tablet 5 mg  5 mg Oral Daily Nira Conn A, NP   5 mg at 06/13/18 0727  . feeding supplement (ENSURE ENLIVE) (ENSURE ENLIVE) liquid 237 mL  237 mL Oral Q24H Cobos, Fernando A, MD      . irbesartan (AVAPRO) tablet 150 mg  150 mg Oral Daily Nira Conn A, NP   150 mg at 06/13/18 1610   And  . hydrochlorothiazide (MICROZIDE) capsule 12.5 mg  12.5 mg Oral Daily Nira Conn A, NP   12.5 mg at 06/13/18 0728  . LORazepam (ATIVAN) tablet 0.5 mg  0.5 mg Oral Q6H PRN Cobos, Rockey Situ, MD   0.5 mg at 06/13/18 0732  . magnesium hydroxide (MILK OF MAGNESIA) suspension 30 mL  30 mL Oral Daily PRN Nira Conn A, NP      . pneumococcal 23 valent vaccine (PNU-IMMUNE) injection 0.5 mL  0.5 mL Intramuscular Tomorrow-1000 Cobos, Fernando A, MD      . QUEtiapine (SEROQUEL) tablet 50 mg  50 mg Oral QHS Cobos, Rockey Situ, MD   50 mg at 06/12/18 2106  . traZODone (DESYREL) tablet 50 mg  50 mg Oral QHS PRN Nira Conn A, NP      . venlafaxine XR (EFFEXOR-XR) 24 hr capsule 75 mg  75 mg Oral QHS Cobos, Rockey Situ, MD   75 mg at 06/12/18 2106    Lab Results:  Results for orders placed or performed during the hospital encounter of 06/12/18 (from the past 48 hour(s))  Hemoglobin A1c     Status: None   Collection Time: 06/12/18  6:43 AM  Result Value Ref Range   Hgb A1c MFr Bld 5.0 4.8 - 5.6 %    Comment: (NOTE) Pre diabetes:          5.7%-6.4% Diabetes:              >6.4% Glycemic control for   <7.0% adults with diabetes    Mean Plasma Glucose 96.8 mg/dL    Comment: Performed at [facility], [address], [place], [place] [address]  Lipid panel     Status: Abnormal   Collection Time: 06/12/18  6:43 AM  Result Value Ref Range   Cholesterol 200 0 - 200 mg/dL   Triglycerides 48 <409 mg/dL   HDL 68 >81 mg/dL   Total CHOL/HDL Ratio 2.9 RATIO   VLDL 10 0 - 40 mg/dL   LDL Cholesterol 191 (H) 0 - 99  mg/dL    Comment:        Total Cholesterol/HDL:CHD Risk Coronary Heart Disease Risk  Table                     Men   Women  1/2 Average Risk   3.4   3.3  Average Risk       5.0   4.4  2 X Average Risk   9.6   7.1  3 X Average Risk  23.4   11.0        Use the calculated Patient Ratio above and the CHD Risk Table to determine the patient's CHD Risk.        ATP III CLASSIFICATION (LDL):  <100     mg/dL   Optimal  540-981  mg/dL   Near or Above                    Optimal  130-159  mg/dL   Borderline  191-478  mg/dL   High  >295     mg/dL   Very High Performed at [facility], [address] [place], [place] [address]   TSH     Status: None   Collection Time: 06/12/18  6:43 AM  Result Value Ref Range   TSH 3.730 0.350 - 4.500 uIU/mL    Comment: Performed by a 3rd Generation assay with a functional sensitivity of <=0.01 uIU/mL. Performed at [facility], [address] [place], [place] [address]     Blood Alcohol level:  Lab Results  Component Value Date   ETH <10 06/11/2018   ETH <10 03/29/2018    Metabolic Disorder Labs: Lab Results  Component Value Date   HGBA1C 5.0 06/12/2018   MPG 96.8 06/12/2018   No results found for: PROLACTIN Lab Results  Component Value Date   CHOL 200 06/12/2018   TRIG 48 06/12/2018   HDL 68 06/12/2018   CHOLHDL 2.9 06/12/2018   VLDL 10 06/12/2018   LDLCALC 122 (H) 06/12/2018    Physical Findings: AIMS: Facial and Oral Movements Muscles of Facial Expression: None, normal Lips and Perioral Area: None, normal Jaw: None, normal Tongue: None, normal,Extremity Movements Upper (arms, wrists, hands, fingers): None, normal Lower (legs, knees, ankles, toes): None, normal, Trunk Movements Neck, shoulders, hips: None, normal, Overall Severity Severity of abnormal movements (highest score from questions above): None, normal Incapacitation due to abnormal movements: None, normal Patient's awareness of  abnormal movements (rate only patient's report): No Awareness, Dental Status Current problems with teeth and/or dentures?: No Does patient usually wear dentures?: No  CIWA:    COWS:     Musculoskeletal: Strength & Muscle Tone: within normal limits Gait & Station: normal Patient leans: N/A  Psychiatric Specialty Exam: Physical Exam  Nursing note and vitals reviewed. Constitutional: She is oriented to person, place, and time. She appears well-developed and well-nourished.  Cardiovascular: Normal rate.  Respiratory: Effort normal.  Musculoskeletal: Normal range of motion.  Neurological: She is alert and oriented to person, place, and time.  Skin: Skin is warm.    Review of Systems  Constitutional: Negative.   HENT: Negative.   Eyes: Negative.   Respiratory: Negative.   Cardiovascular: Negative.   Gastrointestinal: Negative.   Genitourinary: Negative.   Musculoskeletal: Negative.   Skin: Negative.   Neurological: Negative.   Endo/Heme/Allergies: Negative.   Psychiatric/Behavioral: Positive for depression and hallucinations. Negative for substance abuse and suicidal ideas. The patient is nervous/anxious. The patient does not have insomnia.     Blood pressure 110/73, pulse 72, temperature 98 F (36.7 C), temperature source  Oral, resp. rate 18, height 5\' 9"  (1.753 m), weight 72.6 kg, last menstrual period 05/18/2018.Body mass index is 23.63 kg/m.  General Appearance: Casual  Eye Contact:  Fair  Speech:  Clear and Coherent and Normal Rate  Volume:  Decreased  Mood:  Depressed  Affect:  Flat  Thought Process:  Coherent and Descriptions of Associations: Intact  Orientation:  Full (Time, Place, and Person)  Thought Content:  Hallucinations: Auditory  Suicidal Thoughts:  No  Homicidal Thoughts:  No  Memory:  Immediate;   Good Recent;   Good Remote;   Good  Judgement:  Fair  Insight:  Fair  Psychomotor Activity:  Normal  Concentration:  Concentration: Good and Attention  Span: Good  Recall:  Good  Fund of Knowledge:  Good  Language:  Good  Akathisia:  No  Handed:  Right  AIMS (if indicated):     Assets:  Communication Skills Desire for Improvement Financial Resources/Insurance Housing Physical Health Social Support Transportation  ADL's:  Intact  Cognition:  WNL  Sleep:  Number of Hours: 6.75   Problems addressed Major depressive disorder severe with psychotic features  Treatment Plan Summary: Daily contact with patient to assess and evaluate symptoms and progress in treatment, Medication management and Plan is to:  Discussed with patient the potential of increasing the Seroquel to assist with her auditory hallucinations.  Patient states that she does not want to increase that at this time and will continue at the current dose, however, if the auditory hallucinations continue to tomorrow then she will request a provider tomorrow to increase the dose for tomorrow night. Continue Ativan 0.5 mg p.o. every 6 hours as needed for anxiety Continue Seroquel 50 mg p.o. nightly for mood stability Continue trazodone 50 mg nightly as needed for insomnia Continue Effexor XR 75 mg p.o. nightly for mood stability Encourage group therapy participation  Maryfrances Bunnell, FNP 06/13/2018, 9:27 AM

## 2018-06-13 NOTE — Plan of Care (Signed)
Nurse discussed anxiety, depression, coping skills with patient. 

## 2018-06-13 NOTE — Plan of Care (Signed)
  Problem: Safety: Goal: Periods of time without injury will increase Outcome: Progressing Note:  Pt has not harmed self or others tonight.  She denies SI/HI and verbally contracts for safety.    

## 2018-06-13 NOTE — Progress Notes (Signed)
D:  Patient's self inventory sheet, patient sleeps good, no sleep medication.  Rated depression, hopeless and anxiety #3.  Good appetite, normal energy level, good concentration.  Denied withdrawals.  Denied SI.  Denied physical problems.  Denied physical pain.  Goal is stay positive, participate in groups.  No discharge plans. A:  Medications administered per MD orders.  Emotional support and encouragement given patient. R:  Denied SI and HI, contracts for safety.  Denied A/V hallucinations.  Safety maintained with 15 minute checks.

## 2018-06-14 MED ORDER — QUETIAPINE FUMARATE 100 MG PO TABS
100.0000 mg | ORAL_TABLET | Freq: Every day | ORAL | Status: DC
  Administered 2018-06-14: 100 mg via ORAL
  Filled 2018-06-14 (×2): qty 1

## 2018-06-14 MED ORDER — VENLAFAXINE HCL ER 75 MG PO CP24
112.5000 mg | ORAL_CAPSULE | Freq: Every day | ORAL | Status: DC
  Administered 2018-06-14 – 2018-06-16 (×3): 112.5 mg via ORAL
  Filled 2018-06-14 (×5): qty 1

## 2018-06-14 NOTE — [facility] Group Notes (Signed)
Adult Psychoeducational Group Note  Date:  06/14/2018 Time:  2:16 AM  Group Topic/Focus:  Wrap-Up Group:   The focus of this group is to help patients review their daily goal of treatment and discuss progress on daily workbooks.  Participation Level:  Active  Participation Quality:  Appropriate and Attentive  Affect:  Appropriate  Cognitive:  Alert and Appropriate  Insight: Appropriate and Good  Engagement in Group:  Engaged  Modes of Intervention:  Discussion and Education  Additional Comments:  Pt attended and participated in wrap up group this evening. Pt rated their day an 8/10, due to them having a relaxing day. Pt completed their goal, which was to stay positive and to socialize more.   Chrisandra NettersOctavia A Aliani Caccavale 06/14/2018, 2:16 AM

## 2018-06-14 NOTE — Progress Notes (Signed)
[facility] MD Progress Note  06/14/2018 9:56 AM Faith Wells  MRN:  [id number]   Subjective: Describes some improvement.  States she feels her mood is better than it was on admission.  Does continue to describe a subjective sense of low energy and auditory hallucinations, which she describes as hypercritical/demeaning.  Currently does not appear internally preoccupied. Reports some sedation and feeling vaguely "groggy" on medication but denies dizziness or lightheadedness. Today denies suicidal ideations, contracts for safety on unit.  Objective: Have discussed case with treatment team and have met with patient . 42 year old female, presented for worsening depression, neuro-vegetative symptoms of depression, passive SI, auditory hallucinations of a demeaning , insulting nature.   Patient states hallucinations/psychotic symptoms have been present for several months. Currently describes partially improved mood, states she does feel better than she did on admission.Continues to endorse auditory hallucinations, although these may be less than on admission as well.  Currently does not present internally preoccupied and no delusions are expressed. Thought process appears slightly slowed, but no thought blocking noted, and she is able to communicate/interact with peers and staff.  Today she is fully oriented x3, and recall was 3 out of 3 immediate and 3 out of 3 at 5 minutes.  Visible in dayroom, has gone to some groups, no agitated or disruptive behaviors. Currently on Seroquel and Effexor XR, states she felt vaguely groggy earlier.  At this time does not endorse side effects, which we have reviewed.  Agrees to further medication titration. Denies suicidal ideations .  Principal Problem: Severe major depression with psychotic features, mood-congruent ([place]) Diagnosis: Principal Problem:   Severe major depression with psychotic features, mood-congruent ([place])  Total Time spent with patient: 20  minutes  Past Psychiatric History: See H&P  Past Medical History:  Past Medical History:  Diagnosis Date  . Anxiety   . Asthma   . Depression   . GERD (gastroesophageal reflux disease)   . Hypertension     Past Surgical History:  Procedure Laterality Date  . CESAREAN SECTION    . CHOLECYSTECTOMY N/A 10/22/2014   Procedure: LAPAROSCOPIC CHOLECYSTECTOMY WITH INTRAOPERATIVE CHOLANGIOGRAM;  Surgeon: Stark Klein, MD;  Location: [place];  Service: General;  Laterality: N/A;  . HEMORRHOID SURGERY    . RECTAL POLYPECTOMY     Family History: History reviewed. No pertinent family history. Family Psychiatric  History: See H&P Social History:  Social History   Substance and Sexual Activity  Alcohol Use Yes     Social History   Substance and Sexual Activity  Drug Use No    Social History   Socioeconomic History  . Marital status: Single    Spouse name: Not on file  . Number of children: Not on file  . Years of education: Not on file  . Highest education level: Not on file  Occupational History  . Not on file  Social Needs  . Financial resource strain: Not on file  . Food insecurity:    Worry: Not on file    Inability: Not on file  . Transportation needs:    Medical: Not on file    Non-medical: Not on file  Tobacco Use  . Smoking status: Current Every Day Smoker  . Smokeless tobacco: Never Used  Substance and Sexual Activity  . Alcohol use: Yes  . Drug use: No  . Sexual activity: Yes  Lifestyle  . Physical activity:    Days per week: Not on file    Minutes per session: Not  on file  . Stress: Not on file  Relationships  . Social connections:    Talks on phone: Not on file    Gets together: Not on file    Attends religious service: Not on file    Active member of club or organization: Not on file    Attends meetings of clubs or organizations: Not on file    Relationship status: Not on file  Other Topics Concern  . Not on file  Social History Narrative  . Not on  file   Additional Social History:   Sleep: Improving  Appetite:  Fair  Current Medications: Current Facility-Administered Medications  Medication Dose Route Frequency Provider Last Rate Last Dose  . acetaminophen (TYLENOL) tablet 650 mg  650 mg Oral Q6H PRN Lindon Romp A, NP      . alum & mag hydroxide-simeth (MAALOX/MYLANTA) 200-200-20 MG/5ML suspension 30 mL  30 mL Oral Q4H PRN Lindon Romp A, NP      . amLODipine (NORVASC) tablet 5 mg  5 mg Oral Daily Lindon Romp A, NP   5 mg at 06/14/18 0750  . feeding supplement (ENSURE ENLIVE) (ENSURE ENLIVE) liquid 237 mL  237 mL Oral Q24H , Myer Peer, MD   237 mL at 06/13/18 1106  . irbesartan (AVAPRO) tablet 150 mg  150 mg Oral Daily Lindon Romp A, NP   150 mg at 06/14/18 0750   And  . hydrochlorothiazide (MICROZIDE) capsule 12.5 mg  12.5 mg Oral Daily Lindon Romp A, NP   12.5 mg at 06/14/18 0750  . LORazepam (ATIVAN) tablet 0.5 mg  0.5 mg Oral Q6H PRN , Myer Peer, MD   0.5 mg at 06/13/18 0732  . magnesium hydroxide (MILK OF MAGNESIA) suspension 30 mL  30 mL Oral Daily PRN Lindon Romp A, NP      . pneumococcal 23 valent vaccine (PNU-IMMUNE) injection 0.5 mL  0.5 mL Intramuscular Tomorrow-1000 ,  A, MD      . QUEtiapine (SEROQUEL) tablet 100 mg  100 mg Oral QHS , Myer Peer, MD      . traZODone (DESYREL) tablet 50 mg  50 mg Oral QHS PRN Lindon Romp A, NP      . venlafaxine XR (EFFEXOR-XR) 24 hr capsule 112.5 mg  112.5 mg Oral QHS , Myer Peer, MD        Lab Results:  No results found for this or any previous visit (from the past 48 hour(s)).  Blood Alcohol level:  Lab Results  Component Value Date   ETH <10 06/11/2018   ETH <10 81/85/6314    Metabolic Disorder Labs: Lab Results  Component Value Date   HGBA1C 5.0 06/12/2018   MPG 96.8 06/12/2018   No results found for: PROLACTIN Lab Results  Component Value Date   CHOL 200 06/12/2018   TRIG 48 06/12/2018   HDL 68 06/12/2018   CHOLHDL 2.9  06/12/2018   VLDL 10 06/12/2018   LDLCALC 122 (H) 06/12/2018    Physical Findings: AIMS: Facial and Oral Movements Muscles of Facial Expression: None, normal Lips and Perioral Area: None, normal Jaw: None, normal Tongue: None, normal,Extremity Movements Upper (arms, wrists, hands, fingers): None, normal Lower (legs, knees, ankles, toes): None, normal, Trunk Movements Neck, shoulders, hips: None, normal, Overall Severity Severity of abnormal movements (highest score from questions above): None, normal Incapacitation due to abnormal movements: None, normal Patient's awareness of abnormal movements (rate only patient's report): No Awareness, Dental Status Current problems with teeth and/or dentures?: No Does  patient usually wear dentures?: No  CIWA:  CIWA-Ar Total: 1 COWS:  COWS Total Score: 1  Musculoskeletal: Strength & Muscle Tone: within normal limits Gait & Station: normal Patient leans: N/A  Psychiatric Specialty Exam: Physical Exam  Nursing note and vitals reviewed. Constitutional: She is oriented to person, place, and time. She appears well-developed and well-nourished.  Cardiovascular: Normal rate.  Respiratory: Effort normal.  Musculoskeletal: Normal range of motion.  Neurological: She is alert and oriented to person, place, and time.  Skin: Skin is warm.    Review of Systems  Constitutional: Negative.   HENT: Negative.   Eyes: Negative.   Respiratory: Negative.   Cardiovascular: Negative.   Gastrointestinal: Negative.   Genitourinary: Negative.   Musculoskeletal: Negative.   Skin: Negative.   Neurological: Negative.   Endo/Heme/Allergies: Negative.   Psychiatric/Behavioral: Positive for depression and hallucinations. Negative for substance abuse and suicidal ideas. The patient is nervous/anxious. The patient does not have insomnia.   No headache, no chest pain, no shortness of breath, at this time does not endorse dizziness or lightheadedness, reports feeling  vaguely sedated and groggy earlier.  Blood pressure 103/72, pulse 78, temperature 97.6 F (36.4 C), temperature source Oral, resp. rate 16, height 5' 9" (1.753 m), weight 72.6 kg, last menstrual period 05/18/2018.Body mass index is 23.63 kg/m.  General Appearance: Fairly Groomed  Eye Contact:  Eye contact improves during session  Speech:  Normal Rate  Volume:  Decreased  Mood:  Reports she is feeling better, remains depressed  Affect:  Blunted but also noted to improve during session, smiles at times appropriately  Thought Process:  Linear and Descriptions of Associations: Intact  Orientation:  Full (Time, Place, and Person)  Thought Content:  Describes some lingering auditory hallucinations which she characterizes as demeaning/hypercritical, no delusions expressed at this time, does not appear internally preoccupied at present  Suicidal Thoughts:  No-currently denies suicidal ideations, contracts for safety on unit  Homicidal Thoughts:  No  Memory:  3/3 immediate, 3/3 at 3 minutes  Judgement:  Other:  Fair/improving  Insight:  Fair  Psychomotor Activity:  Decreased  Concentration:  Concentration: Good and Attention Span: Good  Recall:  Good  Fund of Knowledge:  Good  Language:  Good  Akathisia:  No  Handed:  Right  AIMS (if indicated):     Assets:  Communication Skills Desire for Improvement Financial Resources/Insurance Housing Physical Health Social Support Transportation  ADL's:  Intact  Cognition:  WNL  Sleep:  Number of Hours: 6.75   Assessment -  42 year old female, presented for worsening depression, neuro-vegetative symptoms of depression, passive SI, auditory hallucinations of a demeaning , insulting nature.   Patient states hallucinations/psychotic symptoms have been present for several months. Patient reports partially improved mood, denies suicidal ideations, describes some lingering auditory hallucinations of a hypercritical/demeaning nature.  Does not appear  internally preoccupied and no delusions are expressed.  Currently on Seroquel and on Effexor XR.  Tolerating well although does describe a subjective sense of feeling groggy following medication administration.  Currently no lightheadedness or dizziness and gait steady.  We discussed options and agrees to further medication titration in order to further address symptoms.  Treatment Plan Summary: Daily contact with patient to assess and evaluate symptoms and progress in treatment, Medication management and Plan is to:  Treatment plan reviewed as below today 11/29 Encourage group and milieu participation to work on coping skills and symptom reduction Continue Ativan 0.5 mg Q6 hours  as needed for  anxiety Increase  Seroquel to 100 mgrs QHS for psychosis, depression Continue Trazodone  50 mg QHS as needed for insomnia Increase Effexor XR to 112.5  mg QHS for depression , anxiety Treatment team working on disposition planning options  Jenne Campus, MD 06/14/2018, 9:56 AM   Patient ID: Beecher Mcardle, female   DOB: 04-22-1976, 42 y.o.   MRN: [id number]

## 2018-06-14 NOTE — Plan of Care (Signed)
  Problem: Activity: Goal: Interest or engagement in activities will improve Outcome: Progressing   Problem: Coping: Goal: Ability to verbalize frustrations and anger appropriately will improve Outcome: Progressing   D: Pt alert and oriented on the unit. Pt sat in the dayroom this morning watching television with minimal interaction with other pts and RN staff. Pt denies SI/HI, and VH, but endorses AH. Pt's affect was flat. Pt rated her depression a 5, feelings of hopelessness a 5, and anxiety a 6, all on a scale of 0 to 10, with 10 being the worst. Pt's goal today is to "work on discharge plan, plan for how to cope with anxiety, depression and other related symptoms." Pt is pleasant and cooperative on the unit.  A: Education, support and encouragement provided, q15 minute safety checks remain in effect. Medications administered per MD orders.  R: No reactions/side effects to medicine noted. Pt denies any concerns at this time, and verbally contracts for safety. Pt ambulating on the unit with no issues. Pt remains safe on and off the unit.

## 2018-06-15 MED ORDER — AMLODIPINE BESYLATE 2.5 MG PO TABS
2.5000 mg | ORAL_TABLET | Freq: Every day | ORAL | Status: DC
  Administered 2018-06-16 – 2018-06-20 (×5): 2.5 mg via ORAL
  Filled 2018-06-15 (×6): qty 1

## 2018-06-15 MED ORDER — QUETIAPINE FUMARATE 50 MG PO TABS
150.0000 mg | ORAL_TABLET | Freq: Every day | ORAL | Status: DC
  Administered 2018-06-15 – 2018-06-16 (×2): 150 mg via ORAL
  Filled 2018-06-15 (×4): qty 3

## 2018-06-15 NOTE — Progress Notes (Signed)
`    D Pt is observed OOB UAL on the 400 hall today. She is quiet, aloof. Her speech is so quiet, her words are barely audible to this Clinical research associatewriter. She makes very brief eye contact. Her questions and words to this writer leave the impression she is  paranoid and guarded. She did attend her lunchmeal in the cafe' with the rest of the patients.     A She completed her daily assessment and on this she wrote she deneid experiencing SI today and she rated her dperession, hopelessness and axneity " 4/4/3", respectively.     R Safety is in place.

## 2018-06-15 NOTE — Progress Notes (Signed)
D.  Pt guarded on approach, questioned medication change.  Pt stated that doctor did speak with her about increase but that she could not remember what dosage that he had told her.  Pt initially refused medication but upon further thought came back to window and stated that she would take it.  Pt would still like to speak to the doctor about the dosage tomorrow of her Effexor.  Pt was positive for evening wrap up group, minimal interaction noted on unit.  Pt denies SI/HI but does continue to endorse some auditory hallucinations.  A.  Support and encouragement offered, medication given as ordered  R.  Pt remains safe on the unit, will continue to monitor.

## 2018-06-15 NOTE — Progress Notes (Signed)
[facility] MD Progress Note  06/15/2018 9:23 AM SHANNEN FLANSBURG  MRN:  [id number]   Subjective:  Patient describes partial improvement compared to admission. States " I feel about 50% better". Denies suicidal ideations. Auditory hallucinations are still present but partially improved and she states they are not as frequent or intense .  Reports feeling " groggy" and slightly sedated, but states that in general she feels medications are well tolerated and helping .  Objective: Have discussed case with treatment team and have met with patient . 42 year old female, presented for worsening depression, neuro-vegetative symptoms of depression, passive SI, auditory hallucinations of a demeaning , insulting nature.   Patient states hallucinations/psychotic symptoms have been present for several months. Patient reports partially improved mood and affect is less blunted, today smiles at times appropriately, more verbal, improving eye contact . Describes lingering but improving auditory hallucinations, and currently does not appear internally preoccupied. Describes hallucinations as insulting,demeaning , such as "calling mestupid ", denies command hallucinations.  No delusions are expressed . She has been visible in milieu/day room, but interactions with peers remain limited . No disruptive or agitated behaviors, polite on approach. Currently on Seroquel and on Effexor XR . She states she feels medications are helping . She describes some sedation and feeling subjectively " groggy". We discussed options , prefers to continue current medications as she feels they are working .  No orthostatic vital sign changes - BP 106/69 , pulse 70   Principal Problem: Severe major depression with psychotic features, mood-congruent (HCC) Diagnosis: Principal Problem:   Severe major depression with psychotic features, mood-congruent ([place])  Total Time spent with patient: 20 minutes  Past Psychiatric History: See H&P  Past  Medical History:  Past Medical History:  Diagnosis Date  . Anxiety   . Asthma   . Depression   . GERD (gastroesophageal reflux disease)   . Hypertension     Past Surgical History:  Procedure Laterality Date  . CESAREAN SECTION    . CHOLECYSTECTOMY N/A 10/22/2014   Procedure: LAPAROSCOPIC CHOLECYSTECTOMY WITH INTRAOPERATIVE CHOLANGIOGRAM;  Surgeon: Stark Klein, MD;  Location: [facility];  Service: General;  Laterality: N/A;  . HEMORRHOID SURGERY    . RECTAL POLYPECTOMY     Family History: History reviewed. No pertinent family history. Family Psychiatric  History: See H&P Social History:  Social History   Substance and Sexual Activity  Alcohol Use Yes     Social History   Substance and Sexual Activity  Drug Use No    Social History   Socioeconomic History  . Marital status: Single    Spouse name: Not on file  . Number of children: Not on file  . Years of education: Not on file  . Highest education level: Not on file  Occupational History  . Not on file  Social Needs  . Financial resource strain: Not on file  . Food insecurity:    Worry: Not on file    Inability: Not on file  . Transportation needs:    Medical: Not on file    Non-medical: Not on file  Tobacco Use  . Smoking status: Current Every Day Smoker  . Smokeless tobacco: Never Used  Substance and Sexual Activity  . Alcohol use: Yes  . Drug use: No  . Sexual activity: Yes  Lifestyle  . Physical activity:    Days per week: Not on file    Minutes per session: Not on file  . Stress: Not on file  Relationships  .  Social connections:    Talks on phone: Not on file    Gets together: Not on file    Attends religious service: Not on file    Active member of club or organization: Not on file    Attends meetings of clubs or organizations: Not on file    Relationship status: Not on file  Other Topics Concern  . Not on file  Social History Narrative  . Not on file   Additional Social History:   Sleep:  Good  Appetite:  Good  Current Medications: Current Facility-Administered Medications  Medication Dose Route Frequency Provider Last Rate Last Dose  . acetaminophen (TYLENOL) tablet 650 mg  650 mg Oral Q6H PRN Lindon Romp A, NP      . alum & mag hydroxide-simeth (MAALOX/MYLANTA) 200-200-20 MG/5ML suspension 30 mL  30 mL Oral Q4H PRN Lindon Romp A, NP      . amLODipine (NORVASC) tablet 5 mg  5 mg Oral Daily Lindon Romp A, NP   5 mg at 06/15/18 0757  . feeding supplement (ENSURE ENLIVE) (ENSURE ENLIVE) liquid 237 mL  237 mL Oral Q24H Yared Susan, Myer Peer, MD   237 mL at 06/15/18 0759  . irbesartan (AVAPRO) tablet 150 mg  150 mg Oral Daily Lindon Romp A, NP   150 mg at 06/15/18 0757   And  . hydrochlorothiazide (MICROZIDE) capsule 12.5 mg  12.5 mg Oral Daily Lindon Romp A, NP   12.5 mg at 06/15/18 0757  . LORazepam (ATIVAN) tablet 0.5 mg  0.5 mg Oral Q6H PRN Anastazia Creek, Myer Peer, MD   0.5 mg at 06/13/18 0732  . magnesium hydroxide (MILK OF MAGNESIA) suspension 30 mL  30 mL Oral Daily PRN Lindon Romp A, NP   30 mL at 06/14/18 2114  . pneumococcal 23 valent vaccine (PNU-IMMUNE) injection 0.5 mL  0.5 mL Intramuscular Tomorrow-1000 Bayani Renteria A, MD      . QUEtiapine (SEROQUEL) tablet 150 mg  150 mg Oral QHS Kary Colaizzi, Myer Peer, MD      . traZODone (DESYREL) tablet 50 mg  50 mg Oral QHS PRN Lindon Romp A, NP      . venlafaxine XR (EFFEXOR-XR) 24 hr capsule 112.5 mg  112.5 mg Oral QHS Amari Zagal, Myer Peer, MD   112.5 mg at 06/14/18 2113    Lab Results:  No results found for this or any previous visit (from the past 48 hour(s)).  Blood Alcohol level:  Lab Results  Component Value Date   ETH <10 06/11/2018   ETH <10 39/76/7341    Metabolic Disorder Labs: Lab Results  Component Value Date   HGBA1C 5.0 06/12/2018   MPG 96.8 06/12/2018   No results found for: PROLACTIN Lab Results  Component Value Date   CHOL 200 06/12/2018   TRIG 48 06/12/2018   HDL 68 06/12/2018   CHOLHDL 2.9  06/12/2018   VLDL 10 06/12/2018   LDLCALC 122 (H) 06/12/2018    Physical Findings: AIMS: Facial and Oral Movements Muscles of Facial Expression: None, normal Lips and Perioral Area: None, normal Jaw: None, normal Tongue: None, normal,Extremity Movements Upper (arms, wrists, hands, fingers): None, normal Lower (legs, knees, ankles, toes): None, normal, Trunk Movements Neck, shoulders, hips: None, normal, Overall Severity Severity of abnormal movements (highest score from questions above): None, normal Incapacitation due to abnormal movements: None, normal Patient's awareness of abnormal movements (rate only patient's report): No Awareness, Dental Status Current problems with teeth and/or dentures?: No Does patient usually wear dentures?: No  CIWA:  CIWA-Ar Total: 1 COWS:  COWS Total Score: 1  Musculoskeletal: Strength & Muscle Tone: within normal limits Gait & Station: normal Patient leans: N/A  Psychiatric Specialty Exam: Physical Exam  Nursing note and vitals reviewed. Constitutional: She is oriented to person, place, and time. She appears well-developed and well-nourished.  Cardiovascular: Normal rate.  Respiratory: Effort normal.  Musculoskeletal: Normal range of motion.  Neurological: She is alert and oriented to person, place, and time.  Skin: Skin is warm.    Review of Systems  Constitutional: Negative.   HENT: Negative.   Eyes: Negative.   Respiratory: Negative.   Cardiovascular: Negative.   Gastrointestinal: Negative.   Genitourinary: Negative.   Musculoskeletal: Negative.   Skin: Negative.   Neurological: Negative.   Endo/Heme/Allergies: Negative.   Psychiatric/Behavioral: Positive for depression and hallucinations. Negative for substance abuse and suicidal ideas. The patient is nervous/anxious. The patient does not have insomnia.   No headache, no chest pain, no shortness of breath, at this time does not endorse dizziness or lightheadedness, reports feeling  vaguely sedated and groggy earlier.  Blood pressure 106/69, pulse 70, temperature 98.2 F (36.8 C), temperature source Oral, resp. rate 16, height 5' 9"  (1.753 m), weight 72.6 kg, last menstrual period 05/18/2018.Body mass index is 23.63 kg/m.  General Appearance: improving grooming   Eye Contact:  Fair  Speech:  Normal Rate  Volume:  Normal  Mood:  improving mood   Affect:  partially improved, less blunted, smiles at times   Thought Process:  Linear and Descriptions of Associations: Intact  Orientation:  Full (Time, Place, and Person)  Thought Content:  decreasing, but still present, auditory hallucinations, described as demeaning, insulting. States they are decreasing . No delusions expressed , does not appear internally preoccupied at this time  Suicidal Thoughts:  No-currently denies suicidal ideations, denies self injurious ideations,  contracts for safety on unit  Homicidal Thoughts:  No  Memory:  3/3 immediate, 3/3 at 3 minutes  Judgement:  Other:  improving   Insight:  improving   Psychomotor Activity:  improving, more visible on unit  Concentration:  Concentration: Good and Attention Span: Good  Recall:  Good  Fund of Knowledge:  Good  Language:  Good  Akathisia:  No  Handed:  Right  AIMS (if indicated):     Assets:  Communication Skills Desire for Improvement Financial Resources/Insurance Housing Physical Health Social Support Transportation  ADL's:  Intact  Cognition:  WNL  Sleep:  Number of Hours: 6.75   Assessment -  42 year old female, presented for worsening depression, neuro-vegetative symptoms of depression, passive SI, auditory hallucinations of a demeaning , insulting nature.   Patient states hallucinations/psychotic symptoms have been present for several months. She presents with gradual, partial improvement. Describes her mood as improving , affect is still blunted, but less than on admission and becomes more reactive during session. Hallucinations persist  but have decreased in frequency and intensity. Denies SI and presents future oriented . Tolerating Seroquel, Effexor XR well, although does report some subjective sedation ( presents fully alert and attentive ) and feeling " groggy". Gait steady. Patient has history of HTN/ BP currently WNL.  Treatment Plan Summary: Daily contact with patient to assess and evaluate symptoms and progress in treatment, Medication management and Plan is to:  Treatment plan reviewed as below today 11/29 Encourage group and milieu participation to work on coping skills and symptom reduction Continue Ativan 0.5 mg Q6 hours  as needed for anxiety Increase  Seroquel to 150 mgrs QHS for psychosis, depression Continue Trazodone  50 mg QHS as needed for insomnia Continue Effexor XR to 112.5  mg QHS for depression , anxiety Reviewed antihypertensive medication management with hospitalist. Recommendation is to decrease Amlodipine to 2.5 mgrs QDAY , continue Avapro/Microzide. Will continue to monitor vitals and for orthostatics.   Treatment team working on disposition [place], MD 06/15/2018, 9:23 AM   Patient ID: Beecher Mcardle, female   DOB: 1975-09-06, 42 y.o.   MRN: [id number]

## 2018-06-15 NOTE — Plan of Care (Signed)
  Problem: Education: Goal: Mental status will improve Outcome: Not Progressing   

## 2018-06-15 NOTE — Progress Notes (Signed)
Pt observed sitting in the dayroom watching TV with other patients.  She reports her day was ok.  She says she is still depressed, and hopes she will see a difference in her mood soon.  She denies SI/HI/AVH.  She complains of constipation this evening and requested MOM at bedtime which was given.  Pt makes her needs known to staff.  Support and encouragement offered.  Discharge plans are in process.  Safety maintained with q15 minute checks.

## 2018-06-15 NOTE — [facility] Group Notes (Signed)
LCSW Group Therapy Note  06/15/2018    10:00-11:00am   Type of Therapy and Topic:  Group Therapy: Early Messages Received About Anger  Participation Level:  Active   Description of Group:   In this group, patients shared and discussed the early messages received in their lives about anger through parental or other adult modeling, teaching, repression, punishment, violence, and more.  Participants identified how those childhood lessons influence even now how they usually or often react when angered.  The group discussed that anger is a secondary emotion and what may be the underlying emotional themes that come out through anger outbursts or that are ignored through anger suppression.  Finally, as a group there was a conversation about the workbook's quote that "There is nothing wrong with anger; it is just a sign something needs to change."     Therapeutic Goals: 1. Patients will identify one or more childhood message about anger that they received and how it was taught to them. 2. Patients will discuss how these childhood experiences have influenced and continue to influence their own expression or repression of anger even today. 3. Patients will explore possible primary emotions that tend to fuel their secondary emotion of anger. 4. Patients will learn that anger itself is normal and cannot be eliminated, and that healthier coping skills can assist with resolving conflict rather than worsening situations.  Summary of Patient Progress:  The patient shared that her childhood lessons about anger were that she should interalize her emotions because her parents were working a lot and she was home by herself, should not bother them with any of her feelings.  As a result, she continues to internalize virtually all her emotions.  She spoke very little but was attentive throughout group.  Therapeutic Modalities:   Cognitive Behavioral Therapy Motivation Interviewing  Lynnell ChadMareida J Grossman-Orr  .

## 2018-06-15 NOTE — Progress Notes (Signed)
Patient rescinded 72 hour request for discharge.

## 2018-06-15 NOTE — [facility] Group Notes (Signed)
Adult Psychoeducational Group Note  Date:  06/15/2018 Time:  10:13 PM  Group Topic/Focus:  Wrap-Up Group:   The focus of this group is to help patients review their daily goal of treatment and discuss progress on daily workbooks.  Participation Level:  Active  Participation Quality:  Appropriate and Attentive  Affect:  Appropriate  Cognitive:  Alert and Appropriate  Insight: Appropriate and Good  Engagement in Group:  Engaged  Modes of Intervention:  Discussion and Education  Additional Comments:  Pt attended and participated in wrap up group this evening. Pt rated their day an 8/10, due to them having a situation happen and they reacted better than they normally would. Pt completed their goal,which was to stay social, by interacting more.   Faith NettersOctavia A Landy Wells 06/15/2018, 10:13 PM

## 2018-06-16 NOTE — [facility] Group Notes (Signed)
[facility] LCSW Group Therapy Note  06/16/2018  10:00-11:00AM  Type of Therapy and Topic:  Group Therapy:  Adding Supports Including Being Your Own Support  Participation Level:  Minimal   Description of Group:  Patients in this group were introduced to the concept that additional supports including self-support are an essential part of recovery.  A song entitled "I Need Help!" was played and a group discussion was held in reaction to the idea of needing to add supports.  A song entitled "My Own Hero" was played and a group discussion ensued in which patients stated they could relate to the song and it inspired them to realize they have be willing to help themselves in order to succeed, because other people cannot achieve sobriety or stability for them.  We discussed adding a variety of healthy supports to address the various needs in their lives.  A song was played called "I Know Where I've Been" toward the end of group and used to conduct an inspirational wrap-up to group of remembering how far they have already come in their journey.  Therapeutic Goals: 1)  demonstrate the importance of being a part of one's own support system 2)  discuss reasons people in one's life may eventually be unable to be continually supportive  3)  identify the patient's current support system and   4)  elicit commitments to add healthy supports and to become more conscious of being self-supportive   Summary of Patient Progress:  The patient expressed that her family and friends may or may not be supportive at different times, saying "it's vague because it is."  She had initially been hesitant to remain in group, and did end up leaving and not returning.  Therapeutic Modalities:   Motivational Interviewing Activity  Lynnell ChadMareida J Grossman-Orr

## 2018-06-16 NOTE — Progress Notes (Signed)
[facility] MD Progress Note  06/16/2018 12:05 PM Faith Wells  MRN:  [id number]   Subjective: Patient reports some improvement.  Today denies suicidal ideations.  States hallucinations have improved noticeably and are less concerning to her now.  Side effects (had reported feeling "groggy" and some sedation) are partially improved as well.   Objective: Have reviewed chart notes and have met with patient . 42 year old female, presented for worsening depression, neuro-vegetative symptoms of depression, passive SI, auditory hallucinations of a demeaning , insulting nature.   Patient states hallucinations/psychotic symptoms have been present for several months. Today patient presents with some improvement compared to admission.  Initially still present somewhat guarded, vaguely irritable, but affect improves noticeably during session and smiles/even laughs appropriately during session today.  As above, describes improving/decreased auditory hallucinations and does not appear internally preoccupied.  Focused and guarded regarding an apparent misfiling of 72 hour letter.  States 72-hour /discharge request letter in  chart was not hers/not her signature.  This is been corrected by nursing staff and patient is in agreement with further inpatient treatment at this time.  Responds well to reassurance/understanding. Currently tolerating medications well and today does not describe significant dizziness or lightheadedness or sedation.  Visible in dayroom, no disruptive behaviors.   Principal Problem: Severe major depression with psychotic features, mood-congruent ([place]) Diagnosis: Principal Problem:   Severe major depression with psychotic features, mood-congruent ([place])  Total Time spent with patient: 20 minutes  Past Psychiatric History: See H&P  Past Medical History:  Past Medical History:  Diagnosis Date  . Anxiety   . Asthma   . Depression   . GERD (gastroesophageal reflux disease)   .  Hypertension     Past Surgical History:  Procedure Laterality Date  . CESAREAN SECTION    . CHOLECYSTECTOMY N/A 10/22/2014   Procedure: LAPAROSCOPIC CHOLECYSTECTOMY WITH INTRAOPERATIVE CHOLANGIOGRAM;  Surgeon: Stark Klein, MD;  Location: [facility];  Service: General;  Laterality: N/A;  . HEMORRHOID SURGERY    . RECTAL POLYPECTOMY     Family History: History reviewed. No pertinent family history. Family Psychiatric  History: See H&P Social History:  Social History   Substance and Sexual Activity  Alcohol Use Yes     Social History   Substance and Sexual Activity  Drug Use No    Social History   Socioeconomic History  . Marital status: Single    Spouse name: Not on file  . Number of children: Not on file  . Years of education: Not on file  . Highest education level: Not on file  Occupational History  . Not on file  Social Needs  . Financial resource strain: Not on file  . Food insecurity:    Worry: Not on file    Inability: Not on file  . Transportation needs:    Medical: Not on file    Non-medical: Not on file  Tobacco Use  . Smoking status: Current Every Day Smoker  . Smokeless tobacco: Never Used  Substance and Sexual Activity  . Alcohol use: Yes  . Drug use: No  . Sexual activity: Yes  Lifestyle  . Physical activity:    Days per week: Not on file    Minutes per session: Not on file  . Stress: Not on file  Relationships  . Social connections:    Talks on phone: Not on file    Gets together: Not on file    Attends religious service: Not on file    Active member  of club or organization: Not on file    Attends meetings of clubs or organizations: Not on file    Relationship status: Not on file  Other Topics Concern  . Not on file  Social History Narrative  . Not on file   Additional Social History:   Sleep: Good  Appetite:  Good  Current Medications: Current Facility-Administered Medications  Medication Dose Route Frequency Provider Last Rate Last  Dose  . acetaminophen (TYLENOL) tablet 650 mg  650 mg Oral Q6H PRN Lindon Romp A, NP      . alum & mag hydroxide-simeth (MAALOX/MYLANTA) 200-200-20 MG/5ML suspension 30 mL  30 mL Oral Q4H PRN Lindon Romp A, NP      . amLODipine (NORVASC) tablet 2.5 mg  2.5 mg Oral Daily Tallia Moehring, Myer Peer, MD   2.5 mg at 06/16/18 0807  . feeding supplement (ENSURE ENLIVE) (ENSURE ENLIVE) liquid 237 mL  237 mL Oral Q24H Rosanne Wohlfarth, Myer Peer, MD   237 mL at 06/16/18 0808  . irbesartan (AVAPRO) tablet 150 mg  150 mg Oral Daily Lindon Romp A, NP   150 mg at 06/16/18 3383   And  . hydrochlorothiazide (MICROZIDE) capsule 12.5 mg  12.5 mg Oral Daily Lindon Romp A, NP   12.5 mg at 06/16/18 2919  . LORazepam (ATIVAN) tablet 0.5 mg  0.5 mg Oral Q6H PRN Gerad Cornelio, Myer Peer, MD   0.5 mg at 06/13/18 0732  . magnesium hydroxide (MILK OF MAGNESIA) suspension 30 mL  30 mL Oral Daily PRN Lindon Romp A, NP   30 mL at 06/14/18 2114  . pneumococcal 23 valent vaccine (PNU-IMMUNE) injection 0.5 mL  0.5 mL Intramuscular Tomorrow-1000 Tereza Gilham A, MD      . QUEtiapine (SEROQUEL) tablet 150 mg  150 mg Oral QHS Jaxx Huish, Myer Peer, MD   150 mg at 06/15/18 2052  . traZODone (DESYREL) tablet 50 mg  50 mg Oral QHS PRN Lindon Romp A, NP      . venlafaxine XR (EFFEXOR-XR) 24 hr capsule 112.5 mg  112.5 mg Oral QHS Ameisha Mcclellan, Myer Peer, MD   112.5 mg at 06/15/18 2053    Lab Results:  No results found for this or any previous visit (from the past 48 hour(s)).  Blood Alcohol level:  Lab Results  Component Value Date   ETH <10 06/11/2018   ETH <10 16/60/6004    Metabolic Disorder Labs: Lab Results  Component Value Date   HGBA1C 5.0 06/12/2018   MPG 96.8 06/12/2018   No results found for: PROLACTIN Lab Results  Component Value Date   CHOL 200 06/12/2018   TRIG 48 06/12/2018   HDL 68 06/12/2018   CHOLHDL 2.9 06/12/2018   VLDL 10 06/12/2018   LDLCALC 122 (H) 06/12/2018    Physical Findings: AIMS: Facial and Oral  Movements Muscles of Facial Expression: None, normal Lips and Perioral Area: None, normal Jaw: None, normal Tongue: None, normal,Extremity Movements Upper (arms, wrists, hands, fingers): None, normal Lower (legs, knees, ankles, toes): None, normal, Trunk Movements Neck, shoulders, hips: None, normal, Overall Severity Severity of abnormal movements (highest score from questions above): None, normal Incapacitation due to abnormal movements: None, normal Patient's awareness of abnormal movements (rate only patient's report): No Awareness, Dental Status Current problems with teeth and/or dentures?: No Does patient usually wear dentures?: No  CIWA:  CIWA-Ar Total: 1 COWS:  COWS Total Score: 1  Musculoskeletal: Strength & Muscle Tone: within normal limits Gait & Station: normal Patient leans: N/A  Psychiatric Specialty  Exam: Physical Exam  Nursing note and vitals reviewed. Constitutional: She is oriented to person, place, and time. She appears well-developed and well-nourished.  Cardiovascular: Normal rate.  Respiratory: Effort normal.  Musculoskeletal: Normal range of motion.  Neurological: She is alert and oriented to person, place, and time.  Skin: Skin is warm.    Review of Systems  Constitutional: Negative.   HENT: Negative.   Eyes: Negative.   Respiratory: Negative.   Cardiovascular: Negative.   Gastrointestinal: Negative.   Genitourinary: Negative.   Musculoskeletal: Negative.   Skin: Negative.   Neurological: Negative.   Endo/Heme/Allergies: Negative.   Psychiatric/Behavioral: Positive for depression and hallucinations. Negative for substance abuse and suicidal ideas. The patient is nervous/anxious. The patient does not have insomnia.   No headache, no chest pain, no shortness of breath, no dizziness or lightheadedness, today does not describe sedation  Blood pressure 126/79, pulse 68, temperature 98.3 F (36.8 C), temperature source Oral, resp. rate 16, height _0   (1.753 m), weight 72.6 kg, last menstrual period 05/18/2018.Body mass index is 23.63 kg/m.  General Appearance: improving grooming   Eye Contact:  Good  Speech:  Normal Rate  Volume:  Normal  Mood:  improving mood   Affect:  Vaguely guarded/irritable at first but fully reactive and improves noticeably during session  Thought Process:  Linear and Descriptions of Associations: Intact  Orientation:  Full (Time, Place, and Person)  Thought Content:  Decreasing/improving auditory hallucinations.  No delusions expressed at this time  Suicidal Thoughts:  No-currently denies suicidal ideations, denies self injurious ideations,  contracts for safety on unit  Homicidal Thoughts:  No  Memory:  3/3 immediate, 3/3 at 3 minutes  Judgement:  Other:  improving   Insight:  improving   Psychomotor Activity:  improving, more visible on unit  Concentration:  Concentration: Good and Attention Span: Good  Recall:  Good  Fund of Knowledge:  Good  Language:  Good  Akathisia:  No  Handed:  Right  AIMS (if indicated):     Assets:  Communication Skills Desire for Improvement Financial Resources/Insurance Housing Physical Health Social Support Transportation  ADL's:  Intact  Cognition:  WNL  Sleep:  Number of Hours: 6.75   Assessment -  42 year old female, presented for worsening depression, neuro-vegetative symptoms of depression, passive SI, auditory hallucinations of a demeaning , insulting nature.   Patient states hallucinations/psychotic symptoms have been present for several months. Patient continues to improve and today presents with a fuller and more reactive range of affect, decreased hallucinations, with improved eye contact and more verbal.  Currently on Effexor XR and Seroquel.  Has tolerated Seroquel titration well and today does not describe significant side effects and presents fully alert and attentive.  We discussed further titration of Effexor XR to 150 mg a day but at this time prefers  current dose. We are currently working on disposition planning options. Treatment Plan Summary: Daily contact with patient to assess and evaluate symptoms and progress in treatment, Medication management and Plan is to:  Treatment plan reviewed as below today 12/1 Encourage group and milieu participation to work on coping skills and symptom reduction Continue Ativan 0.5 mg Q6 hours  as needed for anxiety Continue Seroquel 150 mgrs QHS for psychosis, depression Continue Trazodone  50 mg QHS as needed for insomnia Continue Effexor XR to 112.5  mg QHS for depression , anxiety Continue antihypertensive management- Amlodipine to 2.5 mgrs QDAY , continue Avapro/Microzide. Treatment team working on disposition planning options  Jenne Campus, MD 06/16/2018, 12:05 PM   Patient ID: Beecher Mcardle, female   DOB: 12/19/1975, 42 y.o.   MRN: [id number]

## 2018-06-16 NOTE — Progress Notes (Signed)
[facility] Group Notes:  (Nursing/MHT/Case Management/Adjunct)  Date:  06/16/2018  Time:  1330 Type of Therapy:  Nurse Education with Patricia Duke, RN  Participation Level:  Active  Participation Quality:  Appropriate  Affect:  Appropriate  Cognitive:  Appropriate  Insight:  Appropriate  Engagement in Group:  Engaged  Modes of Intervention:  Discussion and Education     

## 2018-06-16 NOTE — Progress Notes (Signed)
D Pt is observed UAL on the 400 hall today tolerated fair. She remains guarded, paranoid and suspicious. She does, however, almost smirk / smile at this Clinical research associatewriter when she presents  to the med window for her morning meds. She remains tight-lipped, her speech is so low its barely audible by this Clinical research associatewriter and she keeps her physical distance from most people.     A She completed her daily assessment and on this she wrote she deneid SI today and she rated her depresseion , hopelessness and anxiety " 2/2/2/", respectively.      R Safety is in place.

## 2018-06-16 NOTE — Progress Notes (Signed)
D.  Pt pleasant on approach, but guarded.  No complaints voiced.  Pt did question why her conditioner had been placed in nurses station after environmentals.  Pt stated "it was fine before, is it in writing that I can't have it?"  Explained reason to patient and patient laughed and stated "don't apologize, I don't know why I got mad, I wasn't even planning on washing my hair tonight".  Pt stated to MHT that her thoughts and thinking didn't seem right today.  Pt wondered if it could be her medication increase.  Staff encouraged Pt to speak with doctor about her concerns tomorrow.  Pt was positive for evening wrap up group, observed engaged minimally but appropriately with peers on the unit.  Pt denies SI/HI/AVH at this time.  Pt's mother, Briant CedarLaurann Perkin, would like a family session before Patient is discharged as patient will live with her.  Pt's mother stated that she wants to learn how to best support Pt after discharge.  A.  Support and encouragement offered, medication given as ordered  R. Pt remains safe on the unit, will continue to monitor.

## 2018-06-16 NOTE — [facility] Group Notes (Signed)
Adult Psychoeducational Group Note  Date:  06/16/2018 Time:  10:34 PM  Group Topic/Focus:  Wrap-Up Group:   The focus of this group is to help patients review their daily goal of treatment and discuss progress on daily workbooks.  Participation Level:  Active  Participation Quality:  Appropriate and Attentive  Affect:  Appropriate  Cognitive:  Alert and Appropriate  Insight: Appropriate and Good  Engagement in Group:  Engaged  Modes of Intervention:  Discussion and Education  Additional Comments:  Pt attended and participated in wrap up group this evening.   Pt were asked to pick a letter and a word associated with that letter, and then explain what that word means to them. Pt chose letter "L" for laugh. Pt told Clinical research associatewriter that they laughed a lot, which made their day better.   Faith NettersOctavia A Anahi Wells 06/16/2018, 10:34 PM

## 2018-06-16 NOTE — [facility] Group Notes (Signed)
Adult Psychoeducational Group Note  Date:  06/16/2018 Time:  10:56 AM  Group Topic/Focus:  Goals Group:   The focus of this group is to help patients establish daily goals to achieve during treatment and discuss how the patient can incorporate goal setting into their daily lives to aide in recovery.  Participation Level:  Minimal  Participation Quality:  Drowsy  Affect:  Flat  Cognitive:  Lacking  Insight: None  Engagement in Group:  Poor  Modes of Intervention:  Discussion  Additional Comments:  Faith GibbonMichael  [name] 06/16/2018, 10:56 AM

## 2018-06-16 NOTE — Plan of Care (Signed)
  Problem: Education: Goal: Knowledge of [facility] Education information/materials will improve Outcome: Progressing   

## 2018-06-17 MED ORDER — VENLAFAXINE HCL ER 150 MG PO CP24
150.0000 mg | ORAL_CAPSULE | Freq: Every day | ORAL | Status: DC
  Administered 2018-06-17 – 2018-06-19 (×3): 150 mg via ORAL
  Filled 2018-06-17 (×5): qty 1

## 2018-06-17 MED ORDER — QUETIAPINE FUMARATE 200 MG PO TABS
200.0000 mg | ORAL_TABLET | Freq: Every day | ORAL | Status: DC
  Administered 2018-06-17: 200 mg via ORAL
  Filled 2018-06-17 (×3): qty 1

## 2018-06-17 NOTE — Progress Notes (Signed)
Recreation Therapy Notes  Date: 12.2.19 Time: 0930 Location: 300 Hall Dayroom  Group Topic: Stress Management  Goal Area(s) Addresses:  Patient will verbalize importance of using healthy stress management.  Patient will identify positive emotions associated with healthy stress management.   Intervention: Stress Management  Activity :  Meditation.  LRT introduced the stress management technique of meditation.  LRT played a meditation dealing with impermanence.  Patients were to listen and follow along as the meditation played to engage in the technique.  Education:  Stress Management, Discharge Planning.   Education Outcome: Acknowledges edcuation/In group clarification offered/Needs additional education  Clinical Observations/Feedback: Pt did not attend group.    Caroll RancherMarjette Paco Cislo, LRT/CTRS         Caroll RancherLindsay, Vanessa Alesi A 06/17/2018 11:21 AM

## 2018-06-17 NOTE — Progress Notes (Addendum)
[facility] MD Progress Note  06/17/2018 4:25 PM CHERRIE FRANCA  MRN:  [id number]   Subjective: Patient reports feeling "better" than she did on admission.  At this time does not endorse medication side effects.  Denies suicidal ideations.   Objective: Have discussed case with treatment team and have met with patient . 42 year old female, presented for worsening depression, neuro-vegetative symptoms of depression, passive SI, auditory hallucinations of a demeaning , insulting nature.   Patient states hallucinations/psychotic symptoms have been present for several months. Improvement noted.  Patient states she is feeling better than she did prior to admission.  She also presents with a more reactive affect.  Hallucinations have improved significantly, but not completely resolved.  She does not appear internally preoccupied Describes some sedation, but currently presents fully alert and attentive.  No other side effects reported at this time.  Visible on unit, cooperative on approach. With her expressed consent I have spoken with her mother who acknowledges patient is improving but expresses concern about persistence of hallucinations.    Principal Problem: Severe major depression with psychotic features, mood-congruent ([place]) Diagnosis: Principal Problem:   Severe major depression with psychotic features, mood-congruent ([place])  Total Time spent with patient: 20 minutes  Past Psychiatric History: See H&P  Past Medical History:  Past Medical History:  Diagnosis Date  . Anxiety   . Asthma   . Depression   . GERD (gastroesophageal reflux disease)   . Hypertension     Past Surgical History:  Procedure Laterality Date  . CESAREAN SECTION    . CHOLECYSTECTOMY N/A 10/22/2014   Procedure: LAPAROSCOPIC CHOLECYSTECTOMY WITH INTRAOPERATIVE CHOLANGIOGRAM;  Surgeon: Stark Klein, MD;  Location: [place];  Service: General;  Laterality: N/A;  . HEMORRHOID SURGERY    . RECTAL POLYPECTOMY     Family  History: History reviewed. No pertinent family history. Family Psychiatric  History: See H&P Social History:  Social History   Substance and Sexual Activity  Alcohol Use Yes     Social History   Substance and Sexual Activity  Drug Use No    Social History   Socioeconomic History  . Marital status: Single    Spouse name: Not on file  . Number of children: Not on file  . Years of education: Not on file  . Highest education level: Not on file  Occupational History  . Not on file  Social Needs  . Financial resource strain: Not on file  . Food insecurity:    Worry: Not on file    Inability: Not on file  . Transportation needs:    Medical: Not on file    Non-medical: Not on file  Tobacco Use  . Smoking status: Current Every Day Smoker  . Smokeless tobacco: Never Used  Substance and Sexual Activity  . Alcohol use: Yes  . Drug use: No  . Sexual activity: Yes  Lifestyle  . Physical activity:    Days per week: Not on file    Minutes per session: Not on file  . Stress: Not on file  Relationships  . Social connections:    Talks on phone: Not on file    Gets together: Not on file    Attends religious service: Not on file    Active member of club or organization: Not on file    Attends meetings of clubs or organizations: Not on file    Relationship status: Not on file  Other Topics Concern  . Not on file  Social History  Narrative  . Not on file   Additional Social History:   Sleep: Good  Appetite:  Good  Current Medications: Current Facility-Administered Medications  Medication Dose Route Frequency Provider Last Rate Last Dose  . acetaminophen (TYLENOL) tablet 650 mg  650 mg Oral Q6H PRN Lindon Romp A, NP      . alum & mag hydroxide-simeth (MAALOX/MYLANTA) 200-200-20 MG/5ML suspension 30 mL  30 mL Oral Q4H PRN Lindon Romp A, NP      . amLODipine (NORVASC) tablet 2.5 mg  2.5 mg Oral Daily Tryphena Perkovich, Myer Peer, MD   2.5 mg at 06/17/18 0808  . feeding supplement  (ENSURE ENLIVE) (ENSURE ENLIVE) liquid 237 mL  237 mL Oral Q24H Atlantis Delong, Myer Peer, MD   237 mL at 06/17/18 1100  . irbesartan (AVAPRO) tablet 150 mg  150 mg Oral Daily Lindon Romp A, NP   150 mg at 06/17/18 2952   And  . hydrochlorothiazide (MICROZIDE) capsule 12.5 mg  12.5 mg Oral Daily Lindon Romp A, NP   12.5 mg at 06/17/18 8413  . LORazepam (ATIVAN) tablet 0.5 mg  0.5 mg Oral Q6H PRN Beau Vanduzer, Myer Peer, MD   0.5 mg at 06/13/18 0732  . magnesium hydroxide (MILK OF MAGNESIA) suspension 30 mL  30 mL Oral Daily PRN Lindon Romp A, NP   30 mL at 06/14/18 2114  . pneumococcal 23 valent vaccine (PNU-IMMUNE) injection 0.5 mL  0.5 mL Intramuscular Tomorrow-1000 Evan Mackie A, MD      . QUEtiapine (SEROQUEL) tablet 150 mg  150 mg Oral QHS Ruth Kovich, Myer Peer, MD   150 mg at 06/16/18 2053  . traZODone (DESYREL) tablet 50 mg  50 mg Oral QHS PRN Lindon Romp A, NP      . venlafaxine XR (EFFEXOR-XR) 24 hr capsule 112.5 mg  112.5 mg Oral QHS Aubrey Voong, Myer Peer, MD   112.5 mg at 06/16/18 2052    Lab Results:  No results found for this or any previous visit (from the past 48 hour(s)).  Blood Alcohol level:  Lab Results  Component Value Date   ETH <10 06/11/2018   ETH <10 24/40/1027    Metabolic Disorder Labs: Lab Results  Component Value Date   HGBA1C 5.0 06/12/2018   MPG 96.8 06/12/2018   No results found for: PROLACTIN Lab Results  Component Value Date   CHOL 200 06/12/2018   TRIG 48 06/12/2018   HDL 68 06/12/2018   CHOLHDL 2.9 06/12/2018   VLDL 10 06/12/2018   LDLCALC 122 (H) 06/12/2018    Physical Findings: AIMS: Facial and Oral Movements Muscles of Facial Expression: None, normal Lips and Perioral Area: None, normal Jaw: None, normal Tongue: None, normal,Extremity Movements Upper (arms, wrists, hands, fingers): None, normal Lower (legs, knees, ankles, toes): None, normal, Trunk Movements Neck, shoulders, hips: None, normal, Overall Severity Severity of abnormal movements  (highest score from questions above): None, normal Incapacitation due to abnormal movements: None, normal Patient's awareness of abnormal movements (rate only patient's report): No Awareness, Dental Status Current problems with teeth and/or dentures?: No Does patient usually wear dentures?: No  CIWA:  CIWA-Ar Total: 1 COWS:  COWS Total Score: 1  Musculoskeletal: Strength & Muscle Tone: within normal limits Gait & Station: normal Patient leans: N/A  Psychiatric Specialty Exam: Physical Exam  Nursing note and vitals reviewed. Constitutional: She is oriented to person, place, and time. She appears well-developed and well-nourished.  Cardiovascular: Normal rate.  Respiratory: Effort normal.  Musculoskeletal: Normal range of motion.  Neurological:  She is alert and oriented to person, place, and time.  Skin: Skin is warm.    Review of Systems  Constitutional: Negative.   HENT: Negative.   Eyes: Negative.   Respiratory: Negative.   Cardiovascular: Negative.   Gastrointestinal: Negative.   Genitourinary: Negative.   Musculoskeletal: Negative.   Skin: Negative.   Neurological: Negative.   Endo/Heme/Allergies: Negative.   Psychiatric/Behavioral: Positive for depression and hallucinations. Negative for substance abuse and suicidal ideas. The patient is nervous/anxious. The patient does not have insomnia.   No headache, no chest pain, no shortness of breath, no vomiting , no dizziness or lightheadedness  Blood pressure 109/70, pulse 74, temperature (!) 100.4 F (38 C), temperature source Oral, resp. rate 16, height 5' 9"  (1.753 m), weight 72.6 kg, last menstrual period 05/18/2018.Body mass index is 23.63 kg/m.  Repeat temp 98.0 Patient states earlier temp had been taking soon after drinking hot liquid. Denies feeling feverish or any chills.  General Appearance: improving grooming   Eye Contact:  Good  Speech:  Normal Rate  Volume:  Normal  Mood:  gradually improving mood  Affect:   less irritable, affect more reactive, becoming brighter  Thought Process:  Linear and Descriptions of Associations: Intact  Orientation:  Full (Time, Place, and Person)  Thought Content:  Decreasing/improving auditory hallucinations.  No delusions expressed at this time Does not appear internally preoccupied   Suicidal Thoughts:  No-currently denies suicidal ideations, denies self injurious ideations,  contracts for safety on unit  Homicidal Thoughts:  No  Memory:  3/3 immediate, 3/3 at 3 minutes  Judgement:  Other:  improving   Insight:  improving   Psychomotor Activity:  improving, more visible on unit  Concentration:  Concentration: Good and Attention Span: Good  Recall:  Good  Fund of Knowledge:  Good  Language:  Good  Akathisia:  No  Handed:  Right  AIMS (if indicated):     Assets:  Communication Skills Desire for Improvement Financial Resources/Insurance Housing Physical Health Social Support Transportation  ADL's:  Intact  Cognition:  WNL  Sleep:  Number of Hours: 6.75   Assessment -  43 year old female, presented for worsening depression, neuro-vegetative symptoms of depression, passive SI, auditory hallucinations of a demeaning , insulting nature.   Patient states hallucinations/psychotic symptoms have been present for several months.  Gradual improvement noted.  Mood has improved, affect becoming more reactive and brighter, better related overall, appears less guarded and more relaxed and demeanor.  Describes improving but not completely resolved auditory hallucinations.  No suicidal ideations at this time.  Currently on Effexor XR and Seroquel, which she is tolerating well except for some sedation.  Agrees to further titration.  Treatment Plan Summary: Daily contact with patient to assess and evaluate symptoms and progress in treatment, Medication management and Plan is to:  Treatment plan reviewed as below today 12/2 Encourage group and milieu participation to work on  coping skills and symptom reduction Continue Ativan 0.5 mg Q6 hours  as needed for anxiety Increase  Seroquel to 200  mgrs QHS for psychosis, depression Continue Trazodone  50 mg QHS as needed for insomnia Increase Effexor XR to 150  mg QHS for depression , anxiety Continue antihypertensive management- Amlodipine to 2.5 mgrs QDAY , continue Avapro/Microzide. Treatment team working on disposition planning options  Jenne Campus, MD 06/17/2018, 4:25 PM   Patient ID: Beecher Mcardle, female   DOB: 21-Nov-1975, 42 y.o.   MRN: [id number]

## 2018-06-17 NOTE — Progress Notes (Signed)
D:  Patient's self inventory sheet, patient sleep good, no sleep medication.  Good appetite, normal energy level, good concentration.  Rated depression, anxiety, hopeless 1.  Denied withdrawals.  Denied SI.  Denied physical problems.  Denied physical pain.  Goal discuss med doses, side effects, discharge plan, coping skills.  Plans to use positive thoughts, action.  A:  Medications administered per MD orders.  Emotional support and encouragement given patient. R:  Denied SI and HI, contracts for safety.  Denied A/V hallucinations.  Safety maintained with 15 minute checks.

## 2018-06-17 NOTE — [facility] Group Notes (Signed)
[facility] Group Notes:  (Nursing/MHT/Case Management/Adjunct)  Date:  06/17/2018  Time:  4:00 PM Type of Therapy:  Nurse Education  Participation Level:  Active  Participation Quality:  Appropriate  Affect:  Appropriate  Cognitive:  Alert and Appropriate  Insight:  Appropriate and Improving  Engagement in Group:  Engaged and Improving  Modes of Intervention:  Discussion and Education  Summary of Progress/Problems: Patient was appropriate and attentive during RN group.  Halynn Reitano 06/17/2018, 6:09 PM 

## 2018-06-17 NOTE — Progress Notes (Signed)
Patient's mother, Faith Wells, phone [phone], would like family session before patient is discharged.  Mother asked if patient will be discharged tomorrow (Tuesday) or later this week.  Patient's mother said patient told her that voices have decreased but mother is concerned that voices may still tell her to "jump".  That is her main concern.  And is patient still at suicide risk?  Patient's mother is concerned that she ma;y need some help with what she needs to do to help her daughter at home.  Should she take precautions to remove knives, certain things, or anything dangerous to her daughter.  Mother stated she does have one gun at home and it is locked up and daughter cannot get hold of the gun.

## 2018-06-17 NOTE — [facility] Group Notes (Signed)
Adult Psychoeducational Group Note  Date:  06/17/2018 Time:  9:18 AM  Group Topic/Focus:  Goals Group:   The focus of this group is to help patients establish daily goals to achieve during treatment and discuss how the patient can incorporate goal setting into their daily lives to aide in recovery.  Participation Level:  Active  Participation Quality:  Appropriate  Affect:  Appropriate  Cognitive:  Alert  Insight: Appropriate  Engagement in Group:  Engaged  Modes of Intervention:  Discussion  Additional Comments:   Pt attended and participated in orientation/goals group facilitated by MHT Donnetta SimpersMichael A.   Wren Gallaga M Majd Tissue 06/17/2018, 9:18 AM

## 2018-06-17 NOTE — [facility] Group Notes (Signed)
LCSW Group Therapy Note 06/17/2018 12:30 PM  Type of Therapy and Topic: Group Therapy: Overcoming Obstacles  Participation Level: Active  Description of Group:  In this group patients will be encouraged to explore what they see as obstacles to their own wellness and recovery. They will be guided to discuss their thoughts, feelings, and behaviors related to these obstacles. The group will process together ways to cope with barriers, with attention given to specific choices patients can make. Each patient will be challenged to identify changes they are motivated to make in order to overcome their obstacles. This group will be process-oriented, with patients participating in exploration of their own experiences as well as giving and receiving support and challenge from other group members.  Therapeutic Goals: 1. Patient will identify personal and current obstacles as they relate to admission. 2. Patient will identify barriers that currently interfere with their wellness or overcoming obstacles.  3. Patient will identify feelings, thought process and behaviors related to these barriers. 4. Patient will identify two changes they are willing to make to overcome these obstacles:   Summary of Patient Progress  Faith Wells was engaged and participated throughout the group session. Faith Wells states that her main obstacle is her anxiety about returning home with her mother. Faith Wells reports that she feels better, but is concerned about maintaining once she discharges from the hospital.     Therapeutic Modalities:  Cognitive Behavioral Therapy Solution Focused Therapy Motivational Interviewing Relapse Prevention Therapy   Alcario DroughtJolan Zacheriah Stumpe LCSWA Clinical Social Worker

## 2018-06-17 NOTE — Progress Notes (Signed)
D: Pt denies SI/HI/VH. Pt is pleasant and cooperative. Pt stated she was feeling not like herself . Pt stated she was anxious about going home with mother. Pt was encouraged to talk with doctor, pt was encouraged to schedule some therapy sessions to include her mother or go to family therapy. Pt and mom appear to have communication issues.   A: Pt was offered support and encouragement. Pt was given scheduled medications. Pt was encourage to attend groups. Q 15 minute checks were done for safety.  R:Pt attends groups and interacts well with peers and staff. Pt is taking medication. Pt receptive to treatment and safety maintained on unit.  Problem: Education: Goal: Mental status will improve Outcome: Progressing   Problem: Education: Goal: Verbalization of understanding the information provided will improve Outcome: Progressing   Problem: Activity: Goal: Interest or engagement in activities will improve Outcome: Progressing   Problem: Activity: Goal: Sleeping patterns will improve Outcome: Progressing

## 2018-06-17 NOTE — Tx Team (Signed)
Interdisciplinary Treatment and Diagnostic Plan Update  06/17/2018 Time of Session: 9:00am Faith Wells MRN: [id number]  Principal Diagnosis: Severe major depression with psychotic features, mood-congruent (HCC)  Secondary Diagnoses: Principal Problem:   Severe major depression with psychotic features, mood-congruent (HCC)   Current Medications:  Current Facility-Administered Medications  Medication Dose Route Frequency Provider Last Rate Last Dose  . acetaminophen (TYLENOL) tablet 650 mg  650 mg Oral Q6H PRN Nira Conn A, NP      . alum & mag hydroxide-simeth (MAALOX/MYLANTA) 200-200-20 MG/5ML suspension 30 mL  30 mL Oral Q4H PRN Nira Conn A, NP      . amLODipine (NORVASC) tablet 2.5 mg  2.5 mg Oral Daily Cobos, Rockey Situ, MD   2.5 mg at 06/17/18 0808  . feeding supplement (ENSURE ENLIVE) (ENSURE ENLIVE) liquid 237 mL  237 mL Oral Q24H Cobos, Rockey Situ, MD   237 mL at 06/16/18 0808  . irbesartan (AVAPRO) tablet 150 mg  150 mg Oral Daily Nira Conn A, NP   150 mg at 06/17/18 7829   And  . hydrochlorothiazide (MICROZIDE) capsule 12.5 mg  12.5 mg Oral Daily Nira Conn A, NP   12.5 mg at 06/17/18 5621  . LORazepam (ATIVAN) tablet 0.5 mg  0.5 mg Oral Q6H PRN Cobos, Rockey Situ, MD   0.5 mg at 06/13/18 0732  . magnesium hydroxide (MILK OF MAGNESIA) suspension 30 mL  30 mL Oral Daily PRN Nira Conn A, NP   30 mL at 06/14/18 2114  . pneumococcal 23 valent vaccine (PNU-IMMUNE) injection 0.5 mL  0.5 mL Intramuscular Tomorrow-1000 Cobos, Fernando A, MD      . QUEtiapine (SEROQUEL) tablet 150 mg  150 mg Oral QHS Cobos, Rockey Situ, MD   150 mg at 06/16/18 2053  . traZODone (DESYREL) tablet 50 mg  50 mg Oral QHS PRN Nira Conn A, NP      . venlafaxine XR (EFFEXOR-XR) 24 hr capsule 112.5 mg  112.5 mg Oral QHS Cobos, Rockey Situ, MD   112.5 mg at 06/16/18 2052   PTA Medications: Medications Prior to Admission  Medication Sig Dispense Refill Last Dose  . ADDERALL XR 20 MG 24 hr  capsule Take 20 mg by mouth daily.  0 06/10/2018 at Unknown time  . amLODipine (NORVASC) 5 MG tablet Take 5 mg by mouth daily.  1 06/10/2018 at Unknown time  . b complex vitamins tablet Take 1 tablet by mouth daily.   06/11/2018 at Unknown time  . Lactobacillus (BIOTINEX PO) Take 1 tablet by mouth daily.   06/11/2018 at Unknown time  . levonorgestrel (MIRENA) 20 MCG/24HR IUD 1 each by Intrauterine route once.   06/11/2018 at Unknown time  . Multiple Vitamin (MULTIVITAMIN WITH MINERALS) TABS tablet Take 1 tablet by mouth daily.   06/11/2018 at Unknown time  . oxyCODONE-acetaminophen (PERCOCET) 5-325 MG per tablet Take 1-2 tablets by mouth every 4 (four) hours as needed for severe pain. (Patient not taking: Reported on 03/29/2018) 40 tablet 0 Not Taking at Unknown time  . valsartan-hydrochlorothiazide (DIOVAN-HCT) 160-12.5 MG per tablet Take 1 tablet by mouth daily.   1 Past Week at Unknown time  . venlafaxine XR (EFFEXOR-XR) 37.5 MG 24 hr capsule Take 37.5 mg by mouth at bedtime.  0 06/10/2018 at Unknown time    Patient Stressors: Financial difficulties Occupational concerns  Patient Strengths: Capable of independent living Wellsite geologist fund of knowledge Motivation for treatment/growth  Treatment Modalities: Medication Management, Group therapy, Case management,  1 to  1 session with clinician, Psychoeducation, Recreational therapy.   Physician Treatment Plan for Primary Diagnosis: Severe major depression with psychotic features, mood-congruent (HCC) Long Term Goal(s): Improvement in symptoms so as ready for discharge Improvement in symptoms so as ready for discharge   Short Term Goals: Ability to identify changes in lifestyle to reduce recurrence of condition will improve Ability to verbalize feelings will improve Ability to disclose and discuss suicidal ideas Ability to demonstrate self-control will improve Ability to identify and develop effective coping behaviors will  improve Ability to maintain clinical measurements within normal limits will improve Ability to identify changes in lifestyle to reduce recurrence of condition will improve Ability to maintain clinical measurements within normal limits will improve  Medication Management: Evaluate patient's response, side effects, and tolerance of medication regimen.  Therapeutic Interventions: 1 to 1 sessions, Unit Group sessions and Medication administration.  Evaluation of Outcomes: Progressing  Physician Treatment Plan for Secondary Diagnosis: Principal Problem:   Severe major depression with psychotic features, mood-congruent (HCC)  Long Term Goal(s): Improvement in symptoms so as ready for discharge Improvement in symptoms so as ready for discharge   Short Term Goals: Ability to identify changes in lifestyle to reduce recurrence of condition will improve Ability to verbalize feelings will improve Ability to disclose and discuss suicidal ideas Ability to demonstrate self-control will improve Ability to identify and develop effective coping behaviors will improve Ability to maintain clinical measurements within normal limits will improve Ability to identify changes in lifestyle to reduce recurrence of condition will improve Ability to maintain clinical measurements within normal limits will improve     Medication Management: Evaluate patient's response, side effects, and tolerance of medication regimen.  Therapeutic Interventions: 1 to 1 sessions, Unit Group sessions and Medication administration.  Evaluation of Outcomes: Progressing   RN Treatment Plan for Primary Diagnosis: Severe major depression with psychotic features, mood-congruent (HCC) Long Term Goal(s): Knowledge of disease and therapeutic regimen to maintain health will improve  Short Term Goals: Ability to participate in decision making will improve, Ability to verbalize feelings will improve, Ability to disclose and discuss  suicidal ideas and Ability to identify and develop effective coping behaviors will improve  Medication Management: RN will administer medications as ordered by provider, will assess and evaluate patient's response and provide education to patient for prescribed medication. RN will report any adverse and/or side effects to prescribing provider.  Therapeutic Interventions: 1 on 1 counseling sessions, Psychoeducation, Medication administration, Evaluate responses to treatment, Monitor vital signs and CBGs as ordered, Perform/monitor CIWA, COWS, AIMS and Fall Risk screenings as ordered, Perform wound care treatments as ordered.  Evaluation of Outcomes: Progressing   LCSW Treatment Plan for Primary Diagnosis: Severe major depression with psychotic features, mood-congruent (HCC) Long Term Goal(s): Safe transition to appropriate next level of care at discharge, Engage patient in therapeutic group addressing interpersonal concerns.  Short Term Goals: Engage patient in aftercare planning with referrals and resources  Therapeutic Interventions: Assess for all discharge needs, 1 to 1 time with Social worker, Explore available resources and support systems, Assess for adequacy in community support network, Educate family and significant other(s) on suicide prevention, Complete Psychosocial Assessment, Interpersonal group therapy.  Evaluation of Outcomes: Progressing   Progress in Treatment: Attending groups: Yes. Participating in groups: Yes. Taking medication as prescribed: Yes. Toleration medication: Yes. Family/Significant other contact made: Yes, individual(s) contacted:  mother, Briant CedarLaurann Althoff Patient understands diagnosis: Yes. Discussing patient identified problems/goals with staff: Yes. Medical problems stabilized or resolved: Yes. Denies suicidal/homicidal  ideation: Yes. Issues/concerns per patient self-inventory: No.   New problem(s) identified: None   New Short Term/Long Term  Goal(s): medication stabilization, elimination of SI thoughts, development of comprehensive mental wellness plan.    Patient Goals:  To get better and learn how to cope with anxiety, get rid of the voices and to get rid of the wiggly, crawling sensations in my forehead.    Discharge Plan or Barriers: CSW will assess for appropriate referrals and possible discharge planning.   Reason for Continuation of Hospitalization: Anxiety Depression Medication stabilization Suicidal ideation  Estimated Length of Stay: 3-5 days   Attendees: Patient: Faith Wells 06/17/2018 9:14 AM  Physician: Dr. Nehemiah Massed, MD 06/17/2018 9:14 AM  Nursing: Rayfield Citizen.Leonard Schwartz, RN 06/17/2018 9:14 AM  RN Care Manager: 06/17/2018 9:14 AM  Social Worker: Enid Cutter, CSW 06/17/2018 9:14 AM  Recreational Therapist:  06/17/2018 9:14 AM  Other:  06/17/2018 9:14 AM  Other:  06/17/2018 9:14 AM  Other: 06/17/2018 9:14 AM    Scribe for Treatment Team: Darreld Mclean, LCSWA 06/17/2018 9:14 AM

## 2018-06-17 NOTE — Plan of Care (Signed)
Nurse discussed anxiety, depression, coping skills with patient. 

## 2018-06-18 MED ORDER — ZIPRASIDONE MESYLATE 20 MG IM SOLR: 10.0000 mg | Freq: Four times a day (QID) | INTRAMUSCULAR | Status: DC | PRN

## 2018-06-18 MED ORDER — QUETIAPINE FUMARATE 50 MG PO TABS
50.0000 mg | ORAL_TABLET | Freq: Once | ORAL | Status: AC
  Administered 2018-06-18: 50 mg via ORAL
  Filled 2018-06-18 (×2): qty 1

## 2018-06-18 MED ORDER — QUETIAPINE FUMARATE 300 MG PO TABS
300.0000 mg | ORAL_TABLET | Freq: Every day | ORAL | Status: DC
  Administered 2018-06-18 – 2018-06-19 (×2): 300 mg via ORAL
  Filled 2018-06-18 (×3): qty 1

## 2018-06-18 NOTE — Progress Notes (Signed)
Psychoeducational Group Note  Date:  06/18/2018 Time:  2123  Group Topic/Focus:  Wrap-Up Group:   The focus of this group is to help patients review their daily goal of treatment and discuss progress on daily workbooks.  Participation Level: Did Not Attend  Participation Quality:  Not Applicable  Affect:  Not Applicable  Cognitive:  Not Applicable  Insight:  Not Applicable  Engagement in Group: Not Applicable  Additional Comments:  The patient did not attend group since she was speaking with her nurse at that time.   Hazle CocaGOODMAN, Silvano Garofano S 06/18/2018, 9:23 PM

## 2018-06-18 NOTE — Progress Notes (Signed)
[facility] MD Progress Note  06/18/2018 11:47 AM Faith Wells  MRN:  [id number] Subjective: Patient is seen and examined.  Patient is a 42 year old female with a suspected past psychiatric history significant for major depression with psychotic features versus schizoaffective disorder versus major depression with psychosis secondary to psychostimulants.  She is seen in follow-up.  Objective: Patient is seen and examined.  Patient is a 42 year old female with the above-stated past psychiatric history who is seen in follow-up.  She is irritable today.  She stated that Dr. Jama Flavors and told her that she would be going home today.  I reviewed with the notes that I had available from Dr. Jama Flavors that stated she was improving, but that he felt as though she would need 2-3 more days.  She admitted that she continues to have auditory hallucinations.  She stated some of the voices are in her head, and summer outside of her head.  She is unable to really quantitate a great deal of that.  She stated they are doing better than they were on admission.  During the course of the conversation and I am quite clear that she is significantly paranoid as well.  She wanted to see the note that Dr. Jama Flavors had written me that stated that she would be here probably another 2 or 3 days.  I showed it to her, and then she stated that she was unsure if I had written that, or Dr. Jama Flavors had written it.  She also had some other issues with regard to paranoid ideation.  Social work discussed this morning that her mother was uncomfortable with her returning home as she continued to have auditory hallucinations.  I suggested increasing her Seroquel to 300 mg from 200 mg, but she basically stated she would not take it.  I told her we were trying to get those voices under control, and she stated she just wanted to continue the medication as is and let it "kick in".  I told her that the Seroquel did not work that way, and I would like to at least get the  hallucinations down to a lower level than where the rat.  She became quite paranoid at that time even more so.  She denied any suicidal ideation, but she is clearly psychotic.  I discussed with nursing staff the possibility of having to transfer her to the [address] hall given her refusal to take the increased medication and her continued paranoia and hallucinations.  Principal Problem: Severe major depression with psychotic features, mood-congruent (HCC) Diagnosis: Principal Problem:   Severe major depression with psychotic features, mood-congruent (HCC)  Total Time spent with patient: 30 minutes  Past Psychiatric History: Shin H&P  Past Medical History:  Past Medical History:  Diagnosis Date  . Anxiety   . Asthma   . Depression   . GERD (gastroesophageal reflux disease)   . Hypertension     Past Surgical History:  Procedure Laterality Date  . CESAREAN SECTION    . CHOLECYSTECTOMY N/A 10/22/2014   Procedure: LAPAROSCOPIC CHOLECYSTECTOMY WITH INTRAOPERATIVE CHOLANGIOGRAM;  Surgeon: Almond Lint, MD;  Location: [facility] OR;  Service: General;  Laterality: N/A;  . HEMORRHOID SURGERY    . RECTAL POLYPECTOMY     Family History: History reviewed. No pertinent family history. Family Psychiatric  History: See admission H&P Social History:  Social History   Substance and Sexual Activity  Alcohol Use Yes     Social History   Substance and Sexual Activity  Drug Use No  Social History   Socioeconomic History  . Marital status: Single    Spouse name: Not on file  . Number of children: Not on file  . Years of education: Not on file  . Highest education level: Not on file  Occupational History  . Not on file  Social Needs  . Financial resource strain: Not on file  . Food insecurity:    Worry: Not on file    Inability: Not on file  . Transportation needs:    Medical: Not on file    Non-medical: Not on file  Tobacco Use  . Smoking status: Current Every Day Smoker  . Smokeless tobacco:  Never Used  Substance and Sexual Activity  . Alcohol use: Yes  . Drug use: No  . Sexual activity: Yes  Lifestyle  . Physical activity:    Days per week: Not on file    Minutes per session: Not on file  . Stress: Not on file  Relationships  . Social connections:    Talks on phone: Not on file    Gets together: Not on file    Attends religious service: Not on file    Active member of club or organization: Not on file    Attends meetings of clubs or organizations: Not on file    Relationship status: Not on file  Other Topics Concern  . Not on file  Social History Narrative  . Not on file   Additional Social History:                         Sleep: Fair  Appetite:  Fair  Current Medications: Current Facility-Administered Medications  Medication Dose Route Frequency Provider Last Rate Last Dose  . acetaminophen (TYLENOL) tablet 650 mg  650 mg Oral Q6H PRN Nira ConnBerry, Jason A, NP      . alum & mag hydroxide-simeth (MAALOX/MYLANTA) 200-200-20 MG/5ML suspension 30 mL  30 mL Oral Q4H PRN Nira ConnBerry, Jason A, NP      . amLODipine (NORVASC) tablet 2.5 mg  2.5 mg Oral Daily Cobos, Rockey SituFernando A, MD   2.5 mg at 06/18/18 0751  . feeding supplement (ENSURE ENLIVE) (ENSURE ENLIVE) liquid 237 mL  237 mL Oral Q24H Cobos, Rockey SituFernando A, MD   Stopped at 06/18/18 1000  . irbesartan (AVAPRO) tablet 150 mg  150 mg Oral Daily Nira ConnBerry, Jason A, NP   150 mg at 06/18/18 0751   And  . hydrochlorothiazide (MICROZIDE) capsule 12.5 mg  12.5 mg Oral Daily Nira ConnBerry, Jason A, NP   12.5 mg at 06/18/18 0751  . LORazepam (ATIVAN) tablet 0.5 mg  0.5 mg Oral Q6H PRN Cobos, Rockey SituFernando A, MD   0.5 mg at 06/13/18 0732  . magnesium hydroxide (MILK OF MAGNESIA) suspension 30 mL  30 mL Oral Daily PRN Nira ConnBerry, Jason A, NP   30 mL at 06/14/18 2114  . pneumococcal 23 valent vaccine (PNU-IMMUNE) injection 0.5 mL  0.5 mL Intramuscular Tomorrow-1000 Cobos, Fernando A, MD      . QUEtiapine (SEROQUEL) tablet 300 mg  300 mg Oral QHS Antonieta Pertlary,  Greg Lawson, MD      . traZODone (DESYREL) tablet 50 mg  50 mg Oral QHS PRN Nira ConnBerry, Jason A, NP      . venlafaxine XR (EFFEXOR-XR) 24 hr capsule 150 mg  150 mg Oral QHS Cobos, Rockey SituFernando A, MD   150 mg at 06/17/18 2107  . ziprasidone (GEODON) injection 10 mg  10 mg Intramuscular Q6H PRN Clary,  Marlane Mingle, MD        Lab Results: No results found for this or any previous visit (from the past 48 hour(s)).  Blood Alcohol level:  Lab Results  Component Value Date   ETH <10 06/11/2018   ETH <10 03/29/2018    Metabolic Disorder Labs: Lab Results  Component Value Date   HGBA1C 5.0 06/12/2018   MPG 96.8 06/12/2018   No results found for: PROLACTIN Lab Results  Component Value Date   CHOL 200 06/12/2018   TRIG 48 06/12/2018   HDL 68 06/12/2018   CHOLHDL 2.9 06/12/2018   VLDL 10 06/12/2018   LDLCALC 122 (H) 06/12/2018    Physical Findings: AIMS: Facial and Oral Movements Muscles of Facial Expression: None, normal Lips and Perioral Area: None, normal Jaw: None, normal Tongue: None, normal,Extremity Movements Upper (arms, wrists, hands, fingers): None, normal Lower (legs, knees, ankles, toes): None, normal, Trunk Movements Neck, shoulders, hips: None, normal, Overall Severity Severity of abnormal movements (highest score from questions above): None, normal Incapacitation due to abnormal movements: None, normal Patient's awareness of abnormal movements (rate only patient's report): No Awareness, Dental Status Current problems with teeth and/or dentures?: No Does patient usually wear dentures?: No  CIWA:  CIWA-Ar Total: 1 COWS:  COWS Total Score: 1  Musculoskeletal: Strength & Muscle Tone: within normal limits Gait & Station: normal Patient leans: N/A  Psychiatric Specialty Exam: Physical Exam  Nursing note and vitals reviewed. Constitutional: She is oriented to person, place, and time. She appears well-developed and well-nourished.  HENT:  Head: Normocephalic and  atraumatic.  Respiratory: Effort normal.  Neurological: She is alert and oriented to person, place, and time.    ROS  Blood pressure 122/75, pulse 60, temperature 98 F (36.7 C), temperature source Oral, resp. rate 20, height 5\' 9"  (1.753 m), weight 72.6 kg.Body mass index is 23.63 kg/m.  General Appearance: Casual  Eye Contact:  Fair  Speech:  Normal Rate  Volume:  Increased  Mood:  Anxious, Dysphoric and Irritable  Affect:  Congruent  Thought Process:  Coherent and Descriptions of Associations: Loose  Orientation:  Full (Time, Place, and Person)  Thought Content:  Delusions, Hallucinations: Auditory and Paranoid Ideation  Suicidal Thoughts:  No  Homicidal Thoughts:  No  Memory:  Immediate;   Fair Recent;   Fair Remote;   Fair  Judgement:  Impaired  Insight:  Lacking  Psychomotor Activity:  Increased  Concentration:  Concentration: Fair and Attention Span: Fair  Recall:  [employer] of Knowledge:  Fair  Language:  Fair  Akathisia:  Negative  Handed:  Right  AIMS (if indicated):     Assets:  Desire for Improvement Housing Physical Health Resilience Social Support  ADL's:  Intact  Cognition:  WNL  Sleep:  Number of Hours: 6.5     Treatment Plan Summary: Daily contact with patient to assess and evaluate symptoms and progress in treatment, Medication management and Plan Patient is seen and examined.  Patient is a 42 year old female with a past psychiatric history suggestive of posttraumatic stress disorder, but also major depression with psychotic features versus an underlying schizoaffective disorder.  She is seen in follow-up.  She seems worse than what is described in the notes.  She is continued to have auditory hallucinations, and is quite paranoid today.  I suggested increasing her Seroquel, but she is basically refusing that.  If she continues to refuse it that we may have to move her to the [address] hall. 1.  Continue Ativan 0.5 mg every 6 hours as needed for anxiety and  agitation. 2.  Increase Seroquel to 300 mg nightly for psychosis as well as augmentation for depression. 3.  Continue trazodone 50 mg p.o. nightly as needed for insomnia. 4.  Continue Effexor XR 150 mg p.o. nightly for depression and anxiety. 5.  Continue antihypertensive medications including amlodipine to 2.5 mg p.o. daily as well as Avapro/hydrochlorothiazide. 6.  Discharge planning in progress  Antonieta Pert, MD 06/18/2018, 11:47 AM

## 2018-06-18 NOTE — [facility] Group Notes (Signed)
LCSW Group Therapy Note 06/18/2018 1:08 PM  Type of Therapy/Topic: Group Therapy: Feelings about Diagnosis  Participation Level: Did Not Attend   Description of Group:  This group will allow patients to explore their thoughts and feelings about diagnoses they have received. Patients will be guided to explore their level of understanding and acceptance of these diagnoses. Facilitator will encourage patients to process their thoughts and feelings about the reactions of others to their diagnosis and will guide patients in identifying ways to discuss their diagnosis with significant others in their lives. This group will be process-oriented, with patients participating in exploration of their own experiences, giving and receiving support, and processing challenge from other group members.  Therapeutic Goals: 1. Patient will demonstrate understanding of diagnosis as evidenced by identifying two or more symptoms of the disorder 2. Patient will be able to express two feelings regarding the diagnosis 3. Patient will demonstrate their ability to communicate their needs through discussion and/or role play  Summary of Patient Progress:  Invited, chose not to attend.     Therapeutic Modalities:  Cognitive Behavioral Therapy Brief Therapy Feelings Identification    Josclyn Rosales Catalina AntiguaWilliams LCSWA Clinical Social Worker

## 2018-06-18 NOTE — Progress Notes (Signed)
Recreation Therapy Notes  Animal-Assisted Activity (AAA) Program Checklist/Progress Notes Patient Eligibility Criteria Checklist & Daily Group note for Rec Tx Intervention  Date: 12.3.19 Time: 1430 [address]   AAA/T Program Assumption of Risk Form signed by Engineer, productionatient/ or Parent Legal Guardian  YES   Patient is free of allergies or sever asthma  YES   Patient reports no fear of animals  YES   Patient reports no history of cruelty to animals YES   Patient understands his/her participation is voluntary YES   Patient washes hands before animal contact  YES   Patient washes hands after animal contact  YES   Behavioral Response: Engaged  Education: Charity fundraiserHand Washing, Appropriate Animal Interaction   Education Outcome: Acknowledges understanding/In group clarification offered/Needs additional education.   Clinical Observations/Feedback: Pt attended and participated in activity.    Caroll RancherMarjette Jakiah Bienaime, LRT/CTRS    Caroll RancherLindsay, Akirah Storck A 06/18/2018 3:39 PM

## 2018-06-18 NOTE — Plan of Care (Signed)
  Problem: Education: Goal: Emotional status will improve Outcome: Not Progressing Goal: Mental status will improve Outcome: Not Progressing Note:  Patient remains paranoid and is responding to internal stimuli.  D: Patient is angry because she is not going home today.  She remains paranoid and endorses auditory hallucinations.  She denies any thoughts of self harm.  Her goal is to have "positive thoughts and actions."  Patient is suspicious of what MD is telling her.  She was resistant to taking medications, however, she did take a one time dose of seroquel.  She rates her depression, anxiety and hopelessness as a 1.    A: Continue to monitor medication management and MD orders.  Safety checks completed every 15 minutes per protocol.  Offer support and encouragement as needed.  R: Patient is receptive to staff; she remains paranoid.

## 2018-06-18 NOTE — [facility] Group Notes (Signed)
Adult Psychoeducational Group Note  Date:  06/18/2018 Time:  5:37 AM  Group Topic/Focus:  Wrap-Up Group:   The focus of this group is to help patients review their daily goal of treatment and discuss progress on daily workbooks.  Participation Level:  Active  Participation Quality:  Appropriate and Attentive  Affect:  Appropriate  Cognitive:  Alert and Appropriate  Insight: Appropriate and Good  Engagement in Group:  Engaged  Modes of Intervention:  Discussion and Education  Additional Comments:  Pt attended and participated in wrap up group this evening. Pt rated their day an 8/10, due to them being discharged tomorrow. Pt completed their goal, which was to stay positive and to get some rest. Pt also spoke to Dr. About their concerns, which was also a goal of theirs.    Chrisandra NettersOctavia A Valerye Kobus 06/18/2018, 5:37 AM

## 2018-06-18 NOTE — Progress Notes (Signed)
Adult Psychoeducational Group Note  Date:  06/18/2018 Time:  8:47 AM  Group Topic/Focus:  Goals Group:   The focus of this group is to help patients establish daily goals to achieve during treatment and discuss how the patient can incorporate goal setting into their daily lives to aide in recovery.  Participation Level:  Active  Participation Quality:  Appropriate and Attentive  Affect:  Anxious and Appropriate  Cognitive:  Appropriate  Insight: Good  Engagement in Group:  Engaged  Modes of Intervention:  Discussion  Additional Comments:  Pt attended morning goals group.   Deforest Hoyleslexandre J Dylanie Quesenberry 06/18/2018, 8:47 AM

## 2018-06-18 NOTE — Progress Notes (Signed)
D: Pt denies SI/HI/VH , endorsed AH- "better". Pt is pleasant and cooperative. Pt stated she was upset about not leaving today. Writer explained pt did not leave due to her safety , and pt appeared to understand. Pt less agitated this evening.   A: Pt was offered support and encouragement. Pt was given scheduled medications. Pt was encourage to attend groups. Q 15 minute checks were done for safety.  R:Pt attends groups and interacts well with peers and staff. Pt is taking medication. Pt receptive to treatment and safety maintained on unit.  Problem: Education: Goal: Emotional status will improve Outcome: Progressing   Problem: Education: Goal: Mental status will improve Outcome: Progressing   Problem: Activity: Goal: Interest or engagement in activities will improve Outcome: Progressing

## 2018-06-19 MED ORDER — CLOTRIMAZOLE 1 % EX CREA
TOPICAL_CREAM | Freq: Two times a day (BID) | CUTANEOUS | Status: DC
  Administered 2018-06-19 – 2018-06-20 (×2): via TOPICAL
  Filled 2018-06-19: qty 15

## 2018-06-19 NOTE — Progress Notes (Signed)
Recreation Therapy Notes  Date: 12.4.19 Time: 0930 Location: [address] Dayroom  Group Topic: Stress Management  Goal Area(s) Addresses:  Patient will verbalize importance of using healthy stress management.  Patient will identify positive emotions associated with healthy stress management.   Intervention: Stress Management  Activity :  Guided Imagery.  LRT introduced the stress management technique of guided imagery.  LRT read a script that allowed patients to envision their peaceful place.  Patients were to follow along as script was read to engage in activity.  Education:  Stress Management, Discharge Planning.   Education Outcome: Acknowledges edcuation/In group clarification offered/Needs additional education  Clinical Observations/Feedback: Pt did not attend group.     Vail Basista, LRT/CTRS         Kartier Bennison A 06/19/2018 11:26 AM 

## 2018-06-19 NOTE — Progress Notes (Signed)
Adult Psychoeducational Group Note  Date:  06/19/2018 Time:  9:08 AM  Group Topic/Focus:  Goals Group:   The focus of this group is to help patients establish daily goals to achieve during treatment and discuss how the patient can incorporate goal setting into their daily lives to aide in recovery.  Participation Level:  Active  Participation Quality:  Appropriate  Affect:  Appropriate  Cognitive:  Appropriate  Insight: Appropriate and Good  Engagement in Group:  Engaged  Modes of Intervention:  Discussion  Additional Comments:  Pt attended and participated in morning group.   Deforest Hoyleslexandre J Kathia Covington 06/19/2018, 9:08 AM

## 2018-06-19 NOTE — Progress Notes (Signed)
The patient shared in group that she spent much of her day playing board games with her peers. Her goal for tomorrow is to stay up later than normal.

## 2018-06-19 NOTE — Therapy (Signed)
Occupational Therapy Group Note  Date:  06/19/2018 Time:  2:32 PM  Group Topic/Focus:  Self Esteem Action Plan:   The focus of this group is to help patients create a plan to continue to build self-esteem after discharge.  Participation Level:  Active  Participation Quality:  Appropriate  Affect:  Flat  Cognitive:  Appropriate  Insight: Improving  Engagement in Group:  Engaged  Modes of Intervention:  Activity, Discussion, Education and Socialization  Additional Comments:    S: "Negative people decreases my self esteem"  O: Education given on self esteem and its relationship with mental health. Pt to engage in discussion of meaning of self esteem and positives vs negatives that contribute to current status. Personal experiences encouraged to be shared. "Life Story" reflective activity completed, for pt to reflect on past, present, and future. Sharing encouraged at end of activity.  A: Pt presents to group with flat affect, engaged and participatory. Pt shares that negative people impacts her self esteem, but "treating herself" helps to make it positive. Pt was pulled by provider for remainder of group.   Dalphine HandingKaylee Macallister Ashmead, MSOT, OTR/L Behavioral Health OT/ Acute Relief OT PHP Office: [phone]  Dalphine HandingKaylee Janell Keeling 06/19/2018, 2:32 PM

## 2018-06-19 NOTE — Plan of Care (Signed)
  Problem: Education: Goal: Verbalization of understanding the information provided will improve Outcome: Progressing   Problem: Activity: Goal: Sleeping patterns will improve Outcome: Progressing   Problem: Coping: Goal: Ability to verbalize frustrations and anger appropriately will improve Outcome: Progressing Goal: Ability to demonstrate self-control will improve Outcome: Progressing   D: Patient is sleeping and eating well.  She remains paranoid and suspicious.  Her goal today is to "manage to stay awake; medication causes grogginess; positive thoughts/actions."  She denies any thoughts of self harm today.  Patient remains focused on discharge.  She is processing the fact she may be here a couple more days.  Patient states her auditory hallucinations are "better."  A: Continue to monitor medication management and MD orders.  Safety checks completed every 15 minutes per protocol.  Offer support and encouragement as needed.  R: Patient is receptive to staff; her behavior is appropriate.

## 2018-06-19 NOTE — [facility] Group Notes (Signed)
[facility] [employer] Group Therapy      06/19/2018 11:54 AM  Type of Therapy: [employer] Presentation  Participation Level: Active  Participation Quality: Attentive  Affect: Appropriate  Cognitive: Oriented  Insight: Developing/Improving  Engagement in Therapy: Engaged  Modes of Intervention: Discussion, Education and Socialization  Summary of Progress/Problems: Mental [employer] Association (MHA) Speaker came to talk about his personal journey with mental health. The pt processed ways by which to relate to the speaker. MHA speaker provided handouts and educational information pertaining to groups and services offered by the MHA. Pt was engaged in speaker's presentation and was receptive to resources provided.    Gladyes Kudo LCSWA Clinical Social Worker   

## 2018-06-19 NOTE — Progress Notes (Signed)
[facility] MD Progress Note  06/19/2018 10:52 AM Faith Wells  MRN:  [id number] Subjective:  Patient is seen and examined.  Patient is a 42 year old female with a suspected past psychiatric history significant for major depression with psychotic features versus schizoaffective disorder versus major depression with psychosis secondary to psychostimulants.  She is seen in follow-up.  Objective: Patient is seen and examined.  Patient is a 42 year old female with the above-stated past psychiatric history who is seen in follow-up.    She is doing better today.  Much less irritable.  She did take the 300 mg of the Seroquel last night.  She denied any auditory hallucinations today.  She is still somewhat suspicious but not as blatantly paranoid as she had been yesterday.  Her vital signs are stable, she is afebrile.  She slept 6.5 hours last night.  She denied any homicidal or suicidal ideations today.  We discussed some of her paranoid thoughts yesterday, and she still again somewhat suspicious, but not as blatantly paranoid as yesterday.  She still fixated on going home, and we discussed possible discharge dates.  She denied any side effects to any of her medications except for the mild sedation from the Seroquel.  Principal Problem: Severe major depression with psychotic features, mood-congruent (HCC) Diagnosis: Principal Problem:   Severe major depression with psychotic features, mood-congruent (HCC)  Total Time spent with patient: 15 minutes  Past Psychiatric History: The admission H&P  Past Medical History:  Past Medical History:  Diagnosis Date  . Anxiety   . Asthma   . Depression   . GERD (gastroesophageal reflux disease)   . Hypertension     Past Surgical History:  Procedure Laterality Date  . CESAREAN SECTION    . CHOLECYSTECTOMY N/A 10/22/2014   Procedure: LAPAROSCOPIC CHOLECYSTECTOMY WITH INTRAOPERATIVE CHOLANGIOGRAM;  Surgeon: Almond Lint, MD;  Location: [facility] OR;  Service: General;   Laterality: N/A;  . HEMORRHOID SURGERY    . RECTAL POLYPECTOMY     Family History: History reviewed. No pertinent family history. Family Psychiatric  History: See admission H&P Social History:  Social History   Substance and Sexual Activity  Alcohol Use Yes     Social History   Substance and Sexual Activity  Drug Use No    Social History   Socioeconomic History  . Marital status: Single    Spouse name: Not on file  . Number of children: Not on file  . Years of education: Not on file  . Highest education level: Not on file  Occupational History  . Not on file  Social Needs  . Financial resource strain: Not on file  . Food insecurity:    Worry: Not on file    Inability: Not on file  . Transportation needs:    Medical: Not on file    Non-medical: Not on file  Tobacco Use  . Smoking status: Current Every Day Smoker  . Smokeless tobacco: Never Used  Substance and Sexual Activity  . Alcohol use: Yes  . Drug use: No  . Sexual activity: Yes  Lifestyle  . Physical activity:    Days per week: Not on file    Minutes per session: Not on file  . Stress: Not on file  Relationships  . Social connections:    Talks on phone: Not on file    Gets together: Not on file    Attends religious service: Not on file    Active member of club or organization: Not on file  Attends meetings of clubs or organizations: Not on file    Relationship status: Not on file  Other Topics Concern  . Not on file  Social History Narrative  . Not on file   Additional Social History:                         Sleep: Good  Appetite:  Good  Current Medications: Current Facility-Administered Medications  Medication Dose Route Frequency Provider Last Rate Last Dose  . acetaminophen (TYLENOL) tablet 650 mg  650 mg Oral Q6H PRN Nira ConnBerry, Jason A, NP      . alum & mag hydroxide-simeth (MAALOX/MYLANTA) 200-200-20 MG/5ML suspension 30 mL  30 mL Oral Q4H PRN Nira ConnBerry, Jason A, NP      .  amLODipine (NORVASC) tablet 2.5 mg  2.5 mg Oral Daily Cobos, Rockey SituFernando A, MD   2.5 mg at 06/19/18 0803  . feeding supplement (ENSURE ENLIVE) (ENSURE ENLIVE) liquid 237 mL  237 mL Oral Q24H Cobos, Rockey SituFernando A, MD   237 mL at 06/19/18 0805  . irbesartan (AVAPRO) tablet 150 mg  150 mg Oral Daily Nira ConnBerry, Jason A, NP   150 mg at 06/19/18 [id number]   And  . hydrochlorothiazide (MICROZIDE) capsule 12.5 mg  12.5 mg Oral Daily Nira ConnBerry, Jason A, NP   12.5 mg at 06/19/18 0804  . LORazepam (ATIVAN) tablet 0.5 mg  0.5 mg Oral Q6H PRN Cobos, Rockey SituFernando A, MD   0.5 mg at 06/13/18 0732  . magnesium hydroxide (MILK OF MAGNESIA) suspension 30 mL  30 mL Oral Daily PRN Nira ConnBerry, Jason A, NP   30 mL at 06/14/18 2114  . pneumococcal 23 valent vaccine (PNU-IMMUNE) injection 0.5 mL  0.5 mL Intramuscular Tomorrow-1000 Cobos, Fernando A, MD      . QUEtiapine (SEROQUEL) tablet 300 mg  300 mg Oral QHS Antonieta Pertlary, Liliana Brentlinger Lawson, MD   300 mg at 06/18/18 2120  . traZODone (DESYREL) tablet 50 mg  50 mg Oral QHS PRN Nira ConnBerry, Jason A, NP      . venlafaxine XR (EFFEXOR-XR) 24 hr capsule 150 mg  150 mg Oral QHS Cobos, Rockey SituFernando A, MD   150 mg at 06/18/18 2120  . ziprasidone (GEODON) injection 10 mg  10 mg Intramuscular Q6H PRN Antonieta Pertlary, Bryne Lindon Lawson, MD        Lab Results: No results found for this or any previous visit (from the past 48 hour(s)).  Blood Alcohol level:  Lab Results  Component Value Date   ETH <10 06/11/2018   ETH <10 03/29/2018    Metabolic Disorder Labs: Lab Results  Component Value Date   HGBA1C 5.0 06/12/2018   MPG 96.8 06/12/2018   No results found for: PROLACTIN Lab Results  Component Value Date   CHOL 200 06/12/2018   TRIG 48 06/12/2018   HDL 68 06/12/2018   CHOLHDL 2.9 06/12/2018   VLDL 10 06/12/2018   LDLCALC 122 (H) 06/12/2018    Physical Findings: AIMS: Facial and Oral Movements Muscles of Facial Expression: None, normal Lips and Perioral Area: None, normal Jaw: None, normal Tongue: None, normal,Extremity  Movements Upper (arms, wrists, hands, fingers): None, normal Lower (legs, knees, ankles, toes): None, normal, Trunk Movements Neck, shoulders, hips: None, normal, Overall Severity Severity of abnormal movements (highest score from questions above): None, normal Incapacitation due to abnormal movements: None, normal Patient's awareness of abnormal movements (rate only patient's report): No Awareness, Dental Status Current problems with teeth and/or dentures?: No Does patient usually wear dentures?:  No  CIWA:  CIWA-Ar Total: 1 COWS:  COWS Total Score: 1  Musculoskeletal: Strength & Muscle Tone: within normal limits Gait & Station: normal Patient leans: N/A  Psychiatric Specialty Exam: Physical Exam  Nursing note and vitals reviewed. Constitutional: She is oriented to person, place, and time. She appears well-developed and well-nourished.  HENT:  Head: Normocephalic and atraumatic.  Respiratory: Effort normal.  Neurological: She is alert and oriented to person, place, and time.    ROS  Blood pressure 117/82, pulse (!) 59, temperature 98 F (36.7 C), temperature source Oral, resp. rate 20, height 5\' 9"  (1.753 m), weight 72.6 kg.Body mass index is 23.63 kg/m.  General Appearance: Casual  Eye Contact:  Fair  Speech:  Normal Rate  Volume:  Normal  Mood:  Irritable but much less than yesterday.  Affect:  Congruent  Thought Process:  Coherent and Descriptions of Associations: Intact  Orientation:  Full (Time, Place, and Person)  Thought Content:  Paranoid Ideation  Suicidal Thoughts:  No  Homicidal Thoughts:  No  Memory:  Immediate;   Fair Recent;   Fair Remote;   Fair  Judgement:  Intact  Insight:  Lacking  Psychomotor Activity:  Normal  Concentration:  Concentration: Fair and Attention Span: Fair  Recall:  [employer] of Knowledge:  Fair  Language:  Fair  Akathisia:  Negative  Handed:  Right  AIMS (if indicated):     Assets:  Communication Skills Desire for  Improvement Financial Resources/Insurance Housing Physical Health Resilience Social Support  ADL's:  Intact  Cognition:  WNL  Sleep:  Number of Hours: 6.5     Treatment Plan Summary: Daily contact with patient to assess and evaluate symptoms and progress in treatment, Medication management and Plan : Patient is seen and examined.  Patient is a 42 year old female with a past psychiatric history suggestive of posttraumatic stress disorder, but also major depression with psychotic features versus an underlying schizoaffective disorder.  She is seen in follow-up. She is doing little bit better today.  Not quite as paranoid.  She denied auditory hallucinations.  She did take her medications as directed. 1.  Continue Ativan 0.5 mg p.o. every 6 hours as needed for anxiety and agitation. 2.  Continue Seroquel 300 mg p.o. nightly for psychosis, auditory hallucinations and paranoia. 3.  Continue trazodone 50 mg p.o. nightly as needed insomnia. 4.  Continue Effexor XR 150 mg p.o. nightly for depression and anxiety. 5.  Continue antihypertensive medications including amlodipine as well as Avapro/hydrochlorothiazide for hypertension. 6.  Discharge planning in progress, if continues to improve we will consider discharge tomorrow.  Antonieta Pert, MD 06/19/2018, 10:52 AM

## 2018-06-20 MED ORDER — VENLAFAXINE HCL ER 150 MG PO CP24: 150.0000 mg | ORAL_CAPSULE | Freq: Every day | ORAL | 0 refills | Status: AC

## 2018-06-20 MED ORDER — TRAZODONE HCL 50 MG PO TABS: 50.0000 mg | ORAL_TABLET | Freq: Every evening | ORAL | 0 refills | Status: AC | PRN

## 2018-06-20 MED ORDER — VALSARTAN-HYDROCHLOROTHIAZIDE 160-12.5 MG PO TABS: 1.0000 | ORAL_TABLET | Freq: Every day | ORAL | 0 refills | Status: AC

## 2018-06-20 MED ORDER — CLOTRIMAZOLE 1 % EX CREA: TOPICAL_CREAM | Freq: Two times a day (BID) | CUTANEOUS | 0 refills | Status: AC

## 2018-06-20 MED ORDER — AMLODIPINE BESYLATE 2.5 MG PO TABS: 2.5000 mg | ORAL_TABLET | Freq: Every day | ORAL | 0 refills | Status: AC

## 2018-06-20 MED ORDER — QUETIAPINE FUMARATE 300 MG PO TABS: 300.0000 mg | ORAL_TABLET | Freq: Every day | ORAL | 0 refills | Status: AC

## 2018-06-20 NOTE — Discharge Summary (Signed)
Physician Discharge Summary Note  Patient:  Faith Wells is an 43 y.o., female  MRN:  [id number]  DOB:  01/14/1976  Patient phone:  [phone] (home)   Patient address:   [address] [address] [place] [address],   Total Time spent with patient: Greater than 30 minutes  Date of Admission:  06/12/2018  Date of Discharge: 06-20-18  Reason for Admission: Auditory hallucinations x several months.  Principal Problem: Severe major depression with psychotic features, mood-congruent [facility])  Discharge Diagnoses: Principal Problem:   Severe major depression with psychotic features, mood-congruent (HCC)  Past Psychiatric History: MDD with psychosis.  Past Medical History:  Past Medical History:  Diagnosis Date  . Anxiety   . Asthma   . Depression   . GERD (gastroesophageal reflux disease)   . Hypertension     Past Surgical History:  Procedure Laterality Date  . CESAREAN SECTION    . CHOLECYSTECTOMY N/A 10/22/2014   Procedure: LAPAROSCOPIC CHOLECYSTECTOMY WITH INTRAOPERATIVE CHOLANGIOGRAM;  Surgeon: Almond Lint, MD;  Location: [facility] OR;  Service: General;  Laterality: N/A;  . HEMORRHOID SURGERY    . RECTAL POLYPECTOMY     Family History: History reviewed. No pertinent family history.  Family Psychiatric  History: See H&P  Social History:  Social History   Substance and Sexual Activity  Alcohol Use Yes     Social History   Substance and Sexual Activity  Drug Use No    Social History   Socioeconomic History  . Marital status: Single    Spouse name: Not on file  . Number of children: Not on file  . Years of education: Not on file  . Highest education level: Not on file  Occupational History  . Not on file  Social Needs  . Financial resource strain: Not on file  . Food insecurity:    Worry: Not on file    Inability: Not on file  . Transportation needs:    Medical: Not on file    Non-medical: Not on file  Tobacco Use  . Smoking status: Current Every Day Smoker   . Smokeless tobacco: Never Used  Substance and Sexual Activity  . Alcohol use: Yes  . Drug use: No  . Sexual activity: Yes  Lifestyle  . Physical activity:    Days per week: Not on file    Minutes per session: Not on file  . Stress: Not on file  Relationships  . Social connections:    Talks on phone: Not on file    Gets together: Not on file    Attends religious service: Not on file    Active member of club or organization: Not on file    Attends meetings of clubs or organizations: Not on file    Relationship status: Not on file  Other Topics Concern  . Not on file  Social History Narrative  . Not on file   Hospital Course: (Per Md's admission evaluation): 42 year old female, presented to ED voluntarily , at the encouragement of her therapist. Reports she has been experiencing  auditory hallucinations for several months. Describes them as insulting ,states " they tell me I am nothing, a trifling, a ho ". States she recently heard voice telling her to " jump" but denies other command hallucinations. She also endorses significant depression and states depression predated hallucinations. States she has been depressed " for quite some time". Describes neuro-vegetative symptoms of depression as below. Endorses some passive SI, thinking about dying and whether " anyone would  miss me", but denies suicidal plans or intention. Describes unemployment and financial concerns as factors contributing to depression.  After the above admission assessment, Faith Wells was started on the medication regimen for her presenting symptoms. She received & was discharged on; Seroquel 300 mg for mood control, Trazodone 50 mg prn for insomnia & Effexor XR 150 mg for depression. She was enrolled & participated in the group counseling sessions being offered & held on this unit. She learned coping skills. She also received/discharged on other medications for the other pre-existing medical issues presented. She tolerated  her treatment regimen without any adverse effects or reactions reported.   Faith Wells is seen today by the attending psychiatrist for discharge. She says she has normal anxiety about going home. She is not overwhelmed by this. She is looking forward to working on her mental health issues. Not expressing any delusions today. No hallucinations. Feels in control of herself. No suicidal thoughts. Looking forward to getting back to her home. No thoughts of violence. Does not feel depressed. No evidence of mania.  The nursing staff reports that patient has been appropriate on the unit. Patient has been interacting well with peers. No behavioral issues. Patient has not voiced any suicidal thoughts. Patient has not been observed to be internally stimulated or preoccupied. Patient has been adherent with her treatment recommendations. Patient has been tolerating her medications well. No reported adverse effects or reactions.   Patient was discussed at the treatment team meeting this morning. The team members feel that patient is back to her baseline level of function. Team agrees with plan to discharge patient today to continue mental health health care on an outpatient basis as noted below. She was able to engage in safety planning including [facility] or contact emergency services if she feels unable to maintain her own safety or the safety of others. Pt had no further questions, comments or concerns.[facility] with all personal belongings in no apparent distress.  Physical Findings: AIMS: Facial and Oral Movements Muscles of Facial Expression: None, normal Lips and Perioral Area: None, normal Jaw: None, normal Tongue: None, normal,Extremity Movements Upper (arms, wrists, hands, fingers): None, normal Lower (legs, knees, ankles, toes): None, normal, Trunk Movements Neck, shoulders, hips: None, normal, Overall Severity Severity of abnormal movements (highest score from questions above): None,  normal Incapacitation due to abnormal movements: None, normal Patient's awareness of abnormal movements (rate only patient's report): No Awareness, Dental Status Current problems with teeth and/or dentures?: No Does patient usually wear dentures?: No  CIWA:  CIWA-Ar Total: 1 COWS:  COWS Total Score: 1  Musculoskeletal: Strength & Muscle Tone: within normal limits Gait & Station: normal Patient leans: N/A  Psychiatric Specialty Exam: Physical Exam  Nursing note and vitals reviewed. Constitutional: She appears well-developed.  HENT:  Head: Normocephalic.  Eyes: Pupils are equal, round, and reactive to light.  Neck: Normal range of motion.  Respiratory: Effort normal.  GI: Soft.  Genitourinary:  Genitourinary Comments: Deferred  Musculoskeletal: Normal range of motion.  Neurological: She is alert.  Skin: Skin is warm.    Review of Systems  Constitutional: Negative.   HENT: Negative.   Eyes: Negative.   Respiratory: Negative.  Negative for cough and shortness of breath.   Cardiovascular: Negative.  Negative for chest pain and palpitations.  Gastrointestinal: Negative.  Negative for abdominal pain, heartburn, nausea and vomiting.  Genitourinary: Negative.   Musculoskeletal: Negative.   Skin: Negative.   Neurological: Negative.  Negative for dizziness and  headaches.  Endo/Heme/Allergies: Negative.   Psychiatric/Behavioral: Positive for depression (Stable), hallucinations (Hx. Psychosis) and substance abuse (Hx. Psychosis). Negative for memory loss and suicidal ideas. The patient has insomnia (Stable). The patient is not nervous/anxious (Stable).     Blood pressure (!) 122/59, pulse (!) 59, temperature 98 F (36.7 C), temperature source Oral, resp. rate 20, height 5\' 9"  (1.753 m), weight 72.6 kg.Body mass index is 23.63 kg/m.  See Md's discharge SRA   Have you used any form of tobacco in the last 30 days? (Cigarettes, Smokeless Tobacco, Cigars, and/or Pipes): Yes  Has this  patient used any form of tobacco in the last 30 days? (Cigarettes, Smokeless Tobacco, Cigars, and/or Pipes): N/A  Blood Alcohol level:  Lab Results  Component Value Date   ETH <10 06/11/2018   ETH <10 03/29/2018   Metabolic Disorder Labs:  Lab Results  Component Value Date   HGBA1C 5.0 06/12/2018   MPG 96.8 06/12/2018   No results found for: PROLACTIN Lab Results  Component Value Date   CHOL 200 06/12/2018   TRIG 48 06/12/2018   HDL 68 06/12/2018   CHOLHDL 2.9 06/12/2018   VLDL 10 06/12/2018   LDLCALC 122 (H) 06/12/2018    See Psychiatric Specialty Exam and Suicide Risk Assessment completed by Attending Physician prior to discharge.  Discharge destination:  Home  Is patient on multiple antipsychotic therapies at discharge:  No   Has Patient had three or more failed trials of antipsychotic monotherapy by history:  No  Recommended Plan for Multiple Antipsychotic Therapies: NA  Allergies as of 06/20/2018   No Known Allergies     Medication List    STOP taking these medications   ADDERALL XR 20 MG 24 hr capsule Generic drug:  amphetamine-dextroamphetamine   b complex vitamins tablet   BIOTINEX PO   levonorgestrel 20 MCG/24HR IUD Commonly known as:  MIRENA   multivitamin with minerals Tabs tablet   oxyCODONE-acetaminophen 5-325 MG tablet Commonly known as:  PERCOCET/ROXICET     TAKE these medications     Indication  amLODipine 2.5 MG tablet Commonly known as:  NORVASC Take 1 tablet (2.5 mg total) by mouth daily. For high blood pressure Start taking on:  06/21/2018 What changed:    medication strength  how much to take  additional instructions  Indication:  High Blood Pressure Disorder   clotrimazole 1 % cream Commonly known as:  LOTRIMIN Apply topically 2 (two) times daily. For fungal rashes  Indication:  Ringworm of the Body   QUEtiapine 300 MG tablet Commonly known as:  SEROQUEL Take 1 tablet (300 mg total) by mouth at bedtime. For mood  control  Indication:  Mood control   traZODone 50 MG tablet Commonly known as:  DESYREL Take 1 tablet (50 mg total) by mouth at bedtime as needed for sleep.  Indication:  Trouble Sleeping   valsartan-hydrochlorothiazide 160-12.5 MG tablet Commonly known as:  DIOVAN-HCT Take 1 tablet by mouth daily. For high blood pressure What changed:  additional instructions  Indication:  High Blood Pressure Disorder   venlafaxine XR 150 MG 24 hr capsule Commonly known as:  EFFEXOR-XR Take 1 capsule (150 mg total) by mouth at bedtime. For depression What changed:    medication strength  how much to take  additional instructions  Indication:  Major Depressive Disorder      Follow-up Information    [facility]. Go on 06/17/2018.   Specialty:  Behavioral Health Why:   Your hospital follow up appointment is Friday,  06/21/18 at 7:45a.  Please bring: photo ID, proof of insurance, and discharge paperwork from this hospitalization. [address] [place] [address] [phone]          Follow-up recommendations: Activity:  As tolerated Diet: As recommended by your primary care doctor. Keep all scheduled follow-up appointments as recommended.   Comments: Patient is instructed prior to discharge to: Take all medications as prescribed by his/her mental healthcare provider. Report any adverse effects and or reactions from the medicines to his/her outpatient provider promptly. Patient has been instructed & cautioned: To not engage in alcohol and or illegal drug use while on prescription medicines. In the event of worsening symptoms, patient is instructed to call the crisis hotline, 911 and or go to the nearest ED for appropriate evaluation and treatment of symptoms. To follow-up with his/her primary care provider for your other medical issues, concerns and or health care needs.   Signed: Armandina Stammer, NP, PMHNP, FNP-BC 06/20/2018, 11:32 AM

## 2018-06-20 NOTE — Progress Notes (Signed)
Patient discharged to lobby. Patient was stable and appreciative at that time. All papers and prescriptions were given and valuables returned. Verbal understanding expressed. Denies SI/HI and A/VH. Patient given opportunity to express concerns and ask questions.  

## 2018-06-20 NOTE — [facility] Suicide Risk Assessment (Signed)
[facility]   Principal Problem: Severe major depression with psychotic features, mood-congruent (HCC) Discharge Diagnoses: Principal Problem:   Severe major depression with psychotic features, mood-congruent (HCC)   Total Time spent with patient: 15 minutes  Musculoskeletal: Strength & Muscle Tone: within normal limits Gait & Station: normal Patient leans: N/A  Psychiatric Specialty Exam: Review of Systems  All other systems reviewed and are negative.   Blood pressure 117/82, pulse (!) 59, temperature 98 F (36.7 C), temperature source Oral, resp. rate 20, height 5\' 9"  (1.753 m), weight 72.6 kg.Body mass index is 23.63 kg/m.  General Appearance: Casual  Eye Contact::  Fair  Speech:  Normal Rate409  Volume:  Normal  Mood:  Euthymic  Affect:  Congruent  Thought Process:  Coherent and Descriptions of Associations: Intact  Orientation:  Full (Time, Place, and Person)  Thought Content:  Logical  Suicidal Thoughts:  No  Homicidal Thoughts:  No  Memory:  Immediate;   Fair Recent;   Fair Remote;   Fair  Judgement:  Fair  Insight:  Fair  Psychomotor Activity:  Normal  Concentration:  Good  Recall:  Good  Fund of Knowledge:Good  Language: Good  Akathisia:  Negative  Handed:  Right  AIMS (if indicated):     Assets:  Communication Skills Desire for Improvement Financial Resources/Insurance Housing Physical Health Resilience Social Support  Sleep:  Number of Hours: 6.75  Cognition: WNL  ADL's:  Intact   Mental Status Per Nursing Assessment::   On Admission:  Suicidal ideation indicated by patient  Demographic Factors:  Low socioeconomic status and Unemployed  Loss Factors: NA  Historical Factors: Impulsivity  Risk Reduction Factors:   Sense of responsibility to family, Living with another person, especially a relative and Positive social support  Continued Clinical Symptoms:  Depression:   Delusional Impulsivity Schizophrenia:    Depressive state  Cognitive Features That Contribute To Risk:  None    Suicide Risk:  Minimal: No identifiable suicidal ideation.  Patients presenting with no risk factors but with morbid ruminations; may be classified as minimal risk based on the severity of the depressive symptoms  Follow-up Information    [facility]. Go on 06/17/2018.   Specialty:  Behavioral Health Why:  Please attend your hospital follow up appointment on .... at 7:45a.  Please bring: photo ID, proof of insurance, and discharge paperwork from this hospitalization. Contact information: [address] [place] [address] [phone]           Plan Of Care/Follow-up recommendations:  Activity:  ad lib  Antonieta PertGreg Lawson Mikal Blasdell, MD 06/20/2018, 7:31 AM

## 2018-06-20 NOTE — Progress Notes (Signed)
  [facility] Dis[facility] :  Will you be returning to the same living situation after discharge:  Yes,  patient reports she is returning home with her mother At discharge, do you have transportation home?: Yes,  patient reports that her mother is picking her up at discharge Do you have the ability to pay for your medications: No.  Release of information consent forms completed and in the chart;  Patient's signature needed at discharge.  Patient to Follow up at: Follow-up Information    [facility]. Go on 06/17/2018.   Specialty:  Behavioral Health Why:   Your hospital follow up appointment is Friday, 06/21/18 at 7:45a.  Please bring: photo ID, proof of insurance, and discharge paperwork from this hospitalization. Contact information: [address] [place] [address] [phone]           Next level of care provider has [facility] to [facility] Link:yes  Safety Planning and Suicide Prevention discussed: Yes,  with the patient's mother  Have you used any form of tobacco in the last 30 days? (Cigarettes, Smokeless Tobacco, Cigars, and/or Pipes): Yes  Has patient been referred to the Quitline?: Patient refused referral  Patient has been referred for addiction treatment: N/A  Maeola SarahJolan E Duaine Wells, LCSWA 06/20/2018, 11:01 AM

## 2019-06-09 IMAGING — CR DG CHEST 2V
2 series · 2 of 2 positions shown · non-contrast
Comparison: None.

CLINICAL DATA: Chronic shortness of breath. History of asthma.
Current smoker.

EXAM:
CHEST - 2 VIEW

[chest pa]
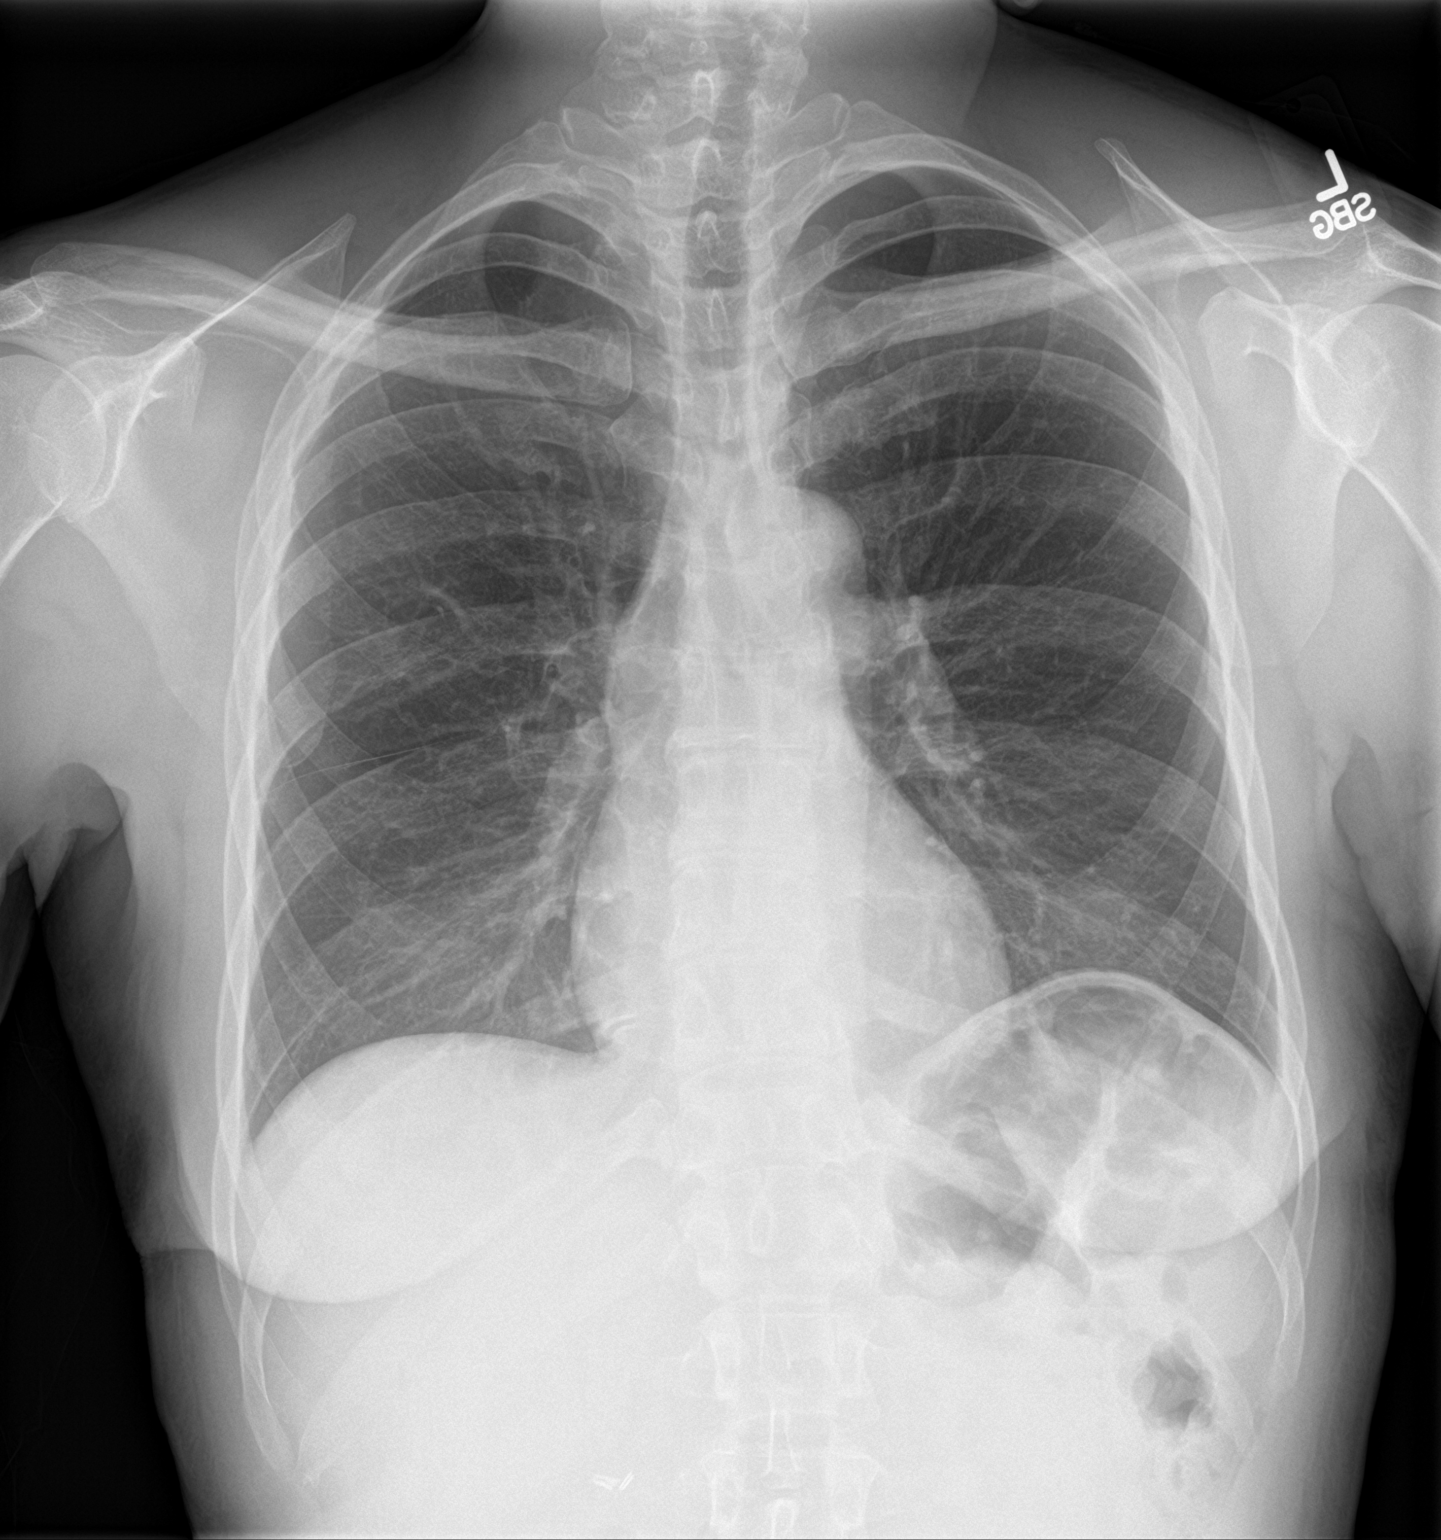

[chest lat]
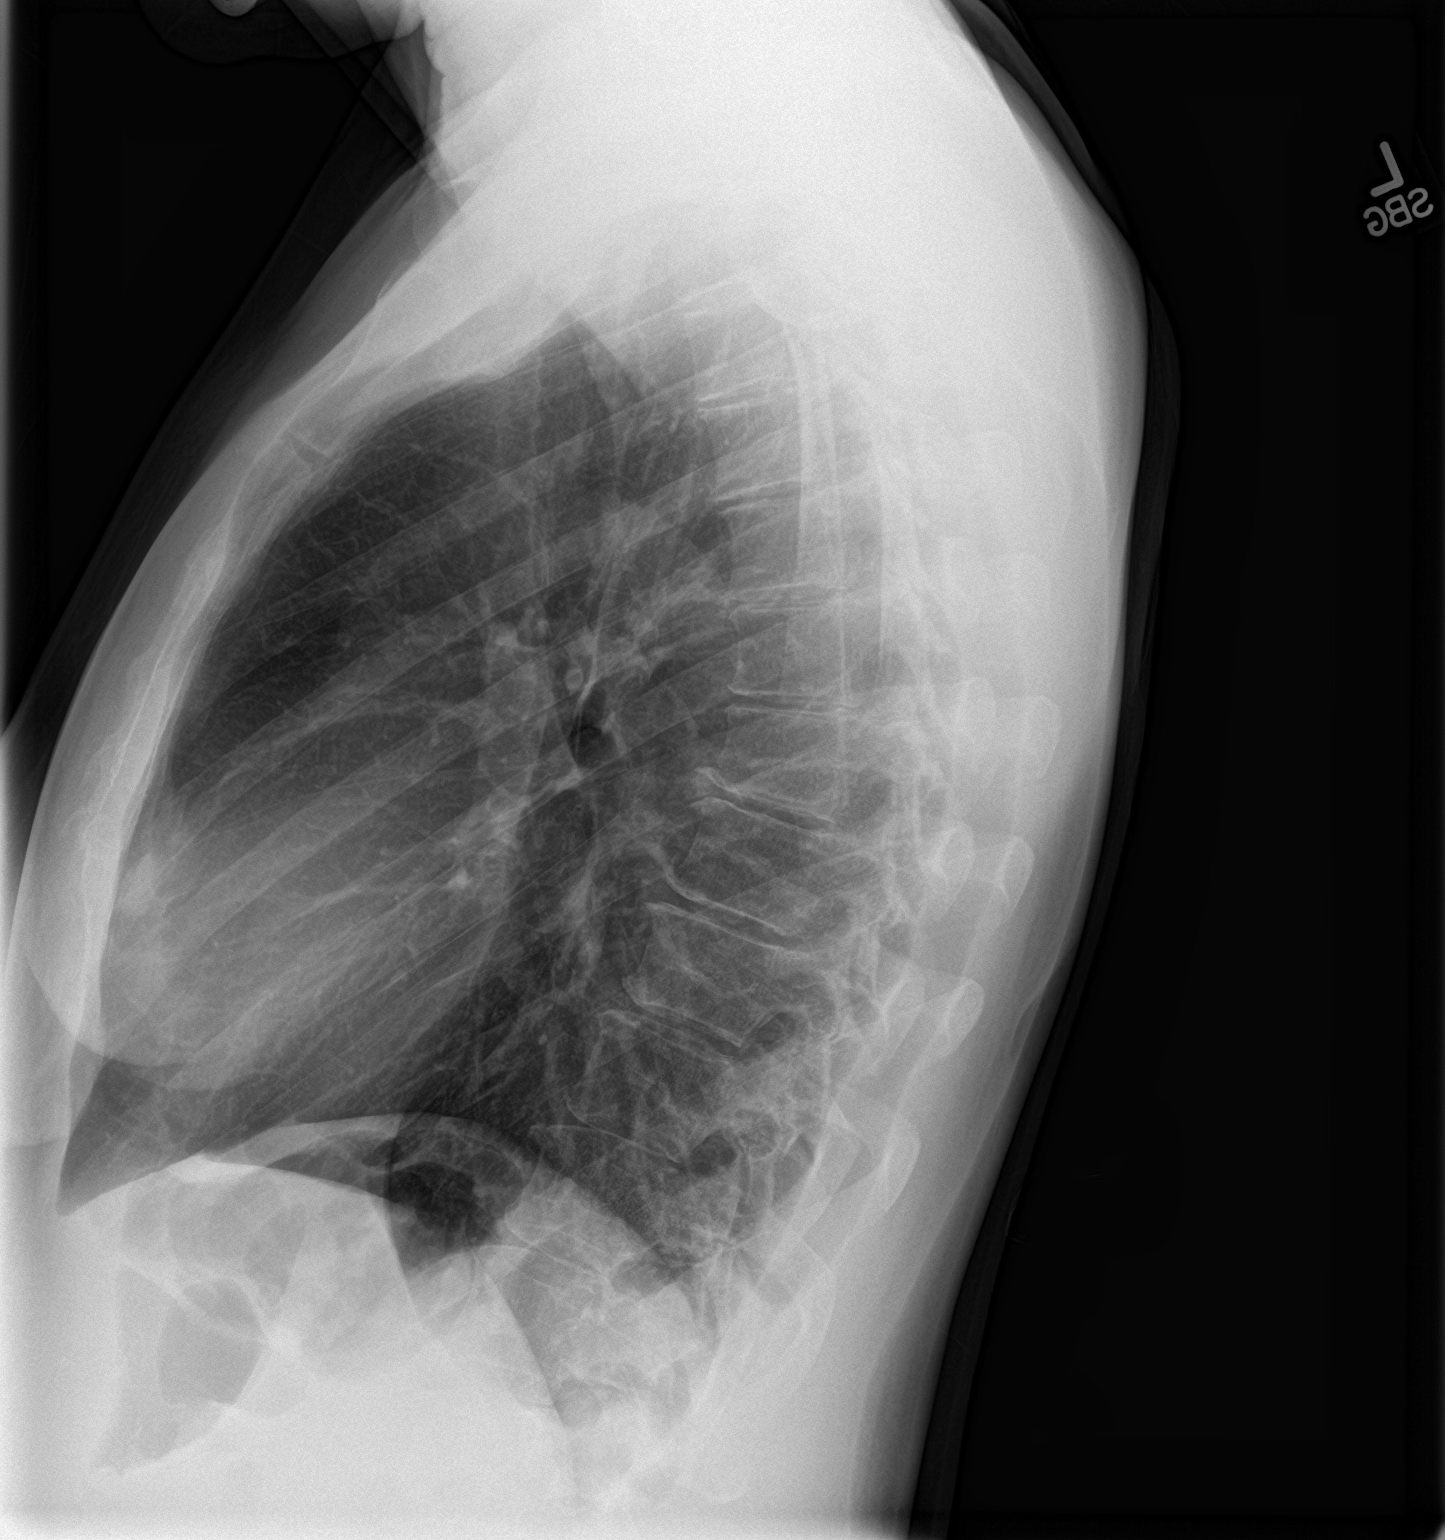

[2 of 2 positions shown; findings below may reference images not displayed]

FINDINGS: The lungs are well-expanded. There is no focal infiltrate. The
interstitial markings are mildly prominent. There is no pleural
effusion. The heart and pulmonary vascularity are normal. The
mediastinum is normal in width. The bony thorax exhibits no acute
abnormality.
IMPRESSION: Mild interstitial prominence consistent with reactive airway disease
and the patient's smoking history. There is no pneumonia nor other
acute cardiopulmonary abnormality.

## 2019-07-17 ENCOUNTER — Ambulatory Visit: Payer: Medicaid Other | Admitting: Allergy

## 2020-08-26 DIAGNOSIS — I10 Essential (primary) hypertension: Secondary | ICD-10-CM | POA: Diagnosis not present

## 2020-08-26 DIAGNOSIS — E6609 Other obesity due to excess calories: Secondary | ICD-10-CM | POA: Diagnosis not present

## 2020-08-26 DIAGNOSIS — F329 Major depressive disorder, single episode, unspecified: Secondary | ICD-10-CM | POA: Diagnosis not present

## 2020-08-26 DIAGNOSIS — R635 Abnormal weight gain: Secondary | ICD-10-CM | POA: Diagnosis not present

## 2020-10-11 DIAGNOSIS — F900 Attention-deficit hyperactivity disorder, predominantly inattentive type: Secondary | ICD-10-CM | POA: Diagnosis not present

## 2020-10-11 DIAGNOSIS — I10 Essential (primary) hypertension: Secondary | ICD-10-CM | POA: Diagnosis not present

## 2020-10-11 DIAGNOSIS — F329 Major depressive disorder, single episode, unspecified: Secondary | ICD-10-CM | POA: Diagnosis not present

## 2020-10-14 DIAGNOSIS — F419 Anxiety disorder, unspecified: Secondary | ICD-10-CM | POA: Diagnosis not present

## 2020-10-14 DIAGNOSIS — R41840 Attention and concentration deficit: Secondary | ICD-10-CM | POA: Diagnosis not present

## 2020-10-14 DIAGNOSIS — F333 Major depressive disorder, recurrent, severe with psychotic symptoms: Secondary | ICD-10-CM | POA: Diagnosis not present

## 2020-10-14 DIAGNOSIS — F430 Acute stress reaction: Secondary | ICD-10-CM | POA: Diagnosis not present

## 2020-11-18 DIAGNOSIS — Z20822 Contact with and (suspected) exposure to covid-19: Secondary | ICD-10-CM | POA: Diagnosis not present

## 2021-03-03 DIAGNOSIS — F419 Anxiety disorder, unspecified: Secondary | ICD-10-CM | POA: Diagnosis not present

## 2021-03-03 DIAGNOSIS — F333 Major depressive disorder, recurrent, severe with psychotic symptoms: Secondary | ICD-10-CM | POA: Diagnosis not present

## 2021-03-03 DIAGNOSIS — R41840 Attention and concentration deficit: Secondary | ICD-10-CM | POA: Diagnosis not present

## 2021-03-03 DIAGNOSIS — F430 Acute stress reaction: Secondary | ICD-10-CM | POA: Diagnosis not present

## 2021-05-16 DIAGNOSIS — R635 Abnormal weight gain: Secondary | ICD-10-CM | POA: Diagnosis not present

## 2021-05-16 DIAGNOSIS — I10 Essential (primary) hypertension: Secondary | ICD-10-CM | POA: Diagnosis not present

## 2021-05-16 DIAGNOSIS — E785 Hyperlipidemia, unspecified: Secondary | ICD-10-CM | POA: Diagnosis not present

## 2021-05-16 DIAGNOSIS — F324 Major depressive disorder, single episode, in partial remission: Secondary | ICD-10-CM | POA: Diagnosis not present

## 2021-05-16 DIAGNOSIS — E6609 Other obesity due to excess calories: Secondary | ICD-10-CM | POA: Diagnosis not present

## 2021-05-16 DIAGNOSIS — R0683 Snoring: Secondary | ICD-10-CM | POA: Diagnosis not present

## 2021-05-16 DIAGNOSIS — R7303 Prediabetes: Secondary | ICD-10-CM | POA: Diagnosis not present

## 2021-06-24 DIAGNOSIS — I10 Essential (primary) hypertension: Secondary | ICD-10-CM | POA: Diagnosis not present

## 2021-06-24 DIAGNOSIS — E7800 Pure hypercholesterolemia, unspecified: Secondary | ICD-10-CM | POA: Diagnosis not present

## 2021-07-14 DIAGNOSIS — F430 Acute stress reaction: Secondary | ICD-10-CM | POA: Diagnosis not present

## 2021-07-14 DIAGNOSIS — R41840 Attention and concentration deficit: Secondary | ICD-10-CM | POA: Diagnosis not present

## 2021-07-14 DIAGNOSIS — F419 Anxiety disorder, unspecified: Secondary | ICD-10-CM | POA: Diagnosis not present

## 2021-07-14 DIAGNOSIS — F333 Major depressive disorder, recurrent, severe with psychotic symptoms: Secondary | ICD-10-CM | POA: Diagnosis not present

## 2021-07-27 DIAGNOSIS — R635 Abnormal weight gain: Secondary | ICD-10-CM | POA: Diagnosis not present

## 2021-07-27 DIAGNOSIS — I10 Essential (primary) hypertension: Secondary | ICD-10-CM | POA: Diagnosis not present

## 2021-08-02 DIAGNOSIS — F333 Major depressive disorder, recurrent, severe with psychotic symptoms: Secondary | ICD-10-CM | POA: Diagnosis not present

## 2021-08-02 DIAGNOSIS — F411 Generalized anxiety disorder: Secondary | ICD-10-CM | POA: Diagnosis not present

## 2021-08-29 DIAGNOSIS — F333 Major depressive disorder, recurrent, severe with psychotic symptoms: Secondary | ICD-10-CM | POA: Diagnosis not present

## 2021-11-07 DIAGNOSIS — I10 Essential (primary) hypertension: Secondary | ICD-10-CM | POA: Diagnosis not present

## 2021-11-07 DIAGNOSIS — E1169 Type 2 diabetes mellitus with other specified complication: Secondary | ICD-10-CM | POA: Diagnosis not present

## 2021-11-07 DIAGNOSIS — F329 Major depressive disorder, single episode, unspecified: Secondary | ICD-10-CM | POA: Diagnosis not present

## 2021-11-07 DIAGNOSIS — N390 Urinary tract infection, site not specified: Secondary | ICD-10-CM | POA: Diagnosis not present

## 2021-11-07 DIAGNOSIS — E782 Mixed hyperlipidemia: Secondary | ICD-10-CM | POA: Diagnosis not present

## 2021-11-23 DIAGNOSIS — F333 Major depressive disorder, recurrent, severe with psychotic symptoms: Secondary | ICD-10-CM | POA: Diagnosis not present

## 2021-11-23 DIAGNOSIS — F411 Generalized anxiety disorder: Secondary | ICD-10-CM | POA: Diagnosis not present

## 2021-11-23 DIAGNOSIS — F430 Acute stress reaction: Secondary | ICD-10-CM | POA: Diagnosis not present

## 2022-03-03 DIAGNOSIS — I10 Essential (primary) hypertension: Secondary | ICD-10-CM | POA: Diagnosis not present

## 2022-03-03 DIAGNOSIS — F330 Major depressive disorder, recurrent, mild: Secondary | ICD-10-CM | POA: Diagnosis not present

## 2022-03-03 DIAGNOSIS — E785 Hyperlipidemia, unspecified: Secondary | ICD-10-CM | POA: Diagnosis not present

## 2022-03-03 DIAGNOSIS — E1169 Type 2 diabetes mellitus with other specified complication: Secondary | ICD-10-CM | POA: Diagnosis not present

## 2022-03-03 DIAGNOSIS — E6609 Other obesity due to excess calories: Secondary | ICD-10-CM | POA: Diagnosis not present

## 2022-04-14 DIAGNOSIS — F411 Generalized anxiety disorder: Secondary | ICD-10-CM | POA: Diagnosis not present

## 2022-04-14 DIAGNOSIS — F430 Acute stress reaction: Secondary | ICD-10-CM | POA: Diagnosis not present

## 2022-04-14 DIAGNOSIS — F333 Major depressive disorder, recurrent, severe with psychotic symptoms: Secondary | ICD-10-CM | POA: Diagnosis not present

## 2022-06-03 DIAGNOSIS — K633 Ulcer of intestine: Secondary | ICD-10-CM | POA: Diagnosis not present

## 2022-06-03 DIAGNOSIS — K6389 Other specified diseases of intestine: Secondary | ICD-10-CM | POA: Diagnosis not present

## 2022-06-03 DIAGNOSIS — I10 Essential (primary) hypertension: Secondary | ICD-10-CM | POA: Diagnosis not present

## 2022-06-03 DIAGNOSIS — Z1211 Encounter for screening for malignant neoplasm of colon: Secondary | ICD-10-CM | POA: Diagnosis not present

## 2022-06-03 DIAGNOSIS — Z87891 Personal history of nicotine dependence: Secondary | ICD-10-CM | POA: Diagnosis not present

## 2022-06-03 DIAGNOSIS — K55039 Acute (reversible) ischemia of large intestine, extent unspecified: Secondary | ICD-10-CM | POA: Diagnosis not present

## 2022-06-03 DIAGNOSIS — K529 Noninfective gastroenteritis and colitis, unspecified: Secondary | ICD-10-CM | POA: Diagnosis not present

## 2022-06-30 ENCOUNTER — Other Ambulatory Visit (HOSPITAL_BASED_OUTPATIENT_CLINIC_OR_DEPARTMENT_OTHER): Payer: Self-pay

## 2022-06-30 DIAGNOSIS — E7800 Pure hypercholesterolemia, unspecified: Secondary | ICD-10-CM | POA: Diagnosis not present

## 2022-06-30 DIAGNOSIS — R7303 Prediabetes: Secondary | ICD-10-CM | POA: Diagnosis not present

## 2022-06-30 DIAGNOSIS — J411 Mucopurulent chronic bronchitis: Secondary | ICD-10-CM | POA: Diagnosis not present

## 2022-06-30 DIAGNOSIS — Z6837 Body mass index (BMI) 37.0-37.9, adult: Secondary | ICD-10-CM | POA: Diagnosis not present

## 2022-06-30 DIAGNOSIS — E785 Hyperlipidemia, unspecified: Secondary | ICD-10-CM | POA: Diagnosis not present

## 2022-06-30 DIAGNOSIS — I10 Essential (primary) hypertension: Secondary | ICD-10-CM | POA: Diagnosis not present

## 2022-06-30 MED ORDER — SEMAGLUTIDE-WEIGHT MANAGEMENT 1 MG/0.5ML ~~LOC~~ SOAJ
1.0000 mg | SUBCUTANEOUS | 0 refills | Status: DC
  Filled 2022-06-30: qty 2, 28d supply, fill #0

## 2022-07-01 ENCOUNTER — Other Ambulatory Visit (HOSPITAL_BASED_OUTPATIENT_CLINIC_OR_DEPARTMENT_OTHER): Payer: Self-pay

## 2022-08-02 DIAGNOSIS — F430 Acute stress reaction: Secondary | ICD-10-CM | POA: Diagnosis not present

## 2022-08-02 DIAGNOSIS — R41840 Attention and concentration deficit: Secondary | ICD-10-CM | POA: Diagnosis not present

## 2022-08-02 DIAGNOSIS — F333 Major depressive disorder, recurrent, severe with psychotic symptoms: Secondary | ICD-10-CM | POA: Diagnosis not present

## 2022-08-02 DIAGNOSIS — F411 Generalized anxiety disorder: Secondary | ICD-10-CM | POA: Diagnosis not present

## 2022-08-05 ENCOUNTER — Other Ambulatory Visit (HOSPITAL_BASED_OUTPATIENT_CLINIC_OR_DEPARTMENT_OTHER): Payer: Self-pay

## 2022-08-05 MED ORDER — SEMAGLUTIDE-WEIGHT MANAGEMENT 1 MG/0.5ML ~~LOC~~ SOAJ
1.0000 mg | SUBCUTANEOUS | 1 refills | Status: AC
  Filled 2022-08-05: qty 2, 28d supply, fill #0
  Filled 2022-09-29: qty 2, 28d supply, fill #1

## 2022-08-11 ENCOUNTER — Other Ambulatory Visit (HOSPITAL_BASED_OUTPATIENT_CLINIC_OR_DEPARTMENT_OTHER): Payer: Self-pay

## 2022-09-22 DIAGNOSIS — F333 Major depressive disorder, recurrent, severe with psychotic symptoms: Secondary | ICD-10-CM | POA: Diagnosis not present

## 2022-09-22 DIAGNOSIS — F430 Acute stress reaction: Secondary | ICD-10-CM | POA: Diagnosis not present

## 2022-09-22 DIAGNOSIS — F411 Generalized anxiety disorder: Secondary | ICD-10-CM | POA: Diagnosis not present

## 2022-09-22 DIAGNOSIS — R41840 Attention and concentration deficit: Secondary | ICD-10-CM | POA: Diagnosis not present

## 2022-09-29 ENCOUNTER — Other Ambulatory Visit (HOSPITAL_BASED_OUTPATIENT_CLINIC_OR_DEPARTMENT_OTHER): Payer: Self-pay

## 2022-10-05 ENCOUNTER — Other Ambulatory Visit (HOSPITAL_BASED_OUTPATIENT_CLINIC_OR_DEPARTMENT_OTHER): Payer: Self-pay

## 2022-10-06 ENCOUNTER — Other Ambulatory Visit (HOSPITAL_BASED_OUTPATIENT_CLINIC_OR_DEPARTMENT_OTHER): Payer: Self-pay

## 2022-10-06 DIAGNOSIS — F329 Major depressive disorder, single episode, unspecified: Secondary | ICD-10-CM | POA: Diagnosis not present

## 2022-10-06 DIAGNOSIS — E785 Hyperlipidemia, unspecified: Secondary | ICD-10-CM | POA: Diagnosis not present

## 2022-10-06 DIAGNOSIS — E1169 Type 2 diabetes mellitus with other specified complication: Secondary | ICD-10-CM | POA: Diagnosis not present

## 2022-10-06 DIAGNOSIS — Z6836 Body mass index (BMI) 36.0-36.9, adult: Secondary | ICD-10-CM | POA: Diagnosis not present

## 2022-10-06 MED ORDER — SEMAGLUTIDE-WEIGHT MANAGEMENT 1.7 MG/0.75ML ~~LOC~~ SOAJ
1.7000 mg | SUBCUTANEOUS | 0 refills | Status: AC
  Filled 2022-10-06 – 2022-11-06 (×2): qty 3, 28d supply, fill #0
  Filled 2022-12-15: qty 3, 28d supply, fill #1
  Filled 2023-01-26: qty 3, 28d supply, fill #2

## 2022-10-16 ENCOUNTER — Other Ambulatory Visit (HOSPITAL_BASED_OUTPATIENT_CLINIC_OR_DEPARTMENT_OTHER): Payer: Self-pay

## 2022-11-04 ENCOUNTER — Other Ambulatory Visit (HOSPITAL_BASED_OUTPATIENT_CLINIC_OR_DEPARTMENT_OTHER): Payer: Self-pay

## 2022-11-06 ENCOUNTER — Other Ambulatory Visit (HOSPITAL_BASED_OUTPATIENT_CLINIC_OR_DEPARTMENT_OTHER): Payer: Self-pay

## 2022-11-14 DIAGNOSIS — F411 Generalized anxiety disorder: Secondary | ICD-10-CM | POA: Diagnosis not present

## 2022-11-14 DIAGNOSIS — F430 Acute stress reaction: Secondary | ICD-10-CM | POA: Diagnosis not present

## 2022-11-14 DIAGNOSIS — F333 Major depressive disorder, recurrent, severe with psychotic symptoms: Secondary | ICD-10-CM | POA: Diagnosis not present

## 2022-12-15 ENCOUNTER — Other Ambulatory Visit (HOSPITAL_BASED_OUTPATIENT_CLINIC_OR_DEPARTMENT_OTHER): Payer: Self-pay

## 2023-01-05 DIAGNOSIS — E1169 Type 2 diabetes mellitus with other specified complication: Secondary | ICD-10-CM | POA: Diagnosis not present

## 2023-01-05 DIAGNOSIS — I10 Essential (primary) hypertension: Secondary | ICD-10-CM | POA: Diagnosis not present

## 2023-01-05 DIAGNOSIS — F329 Major depressive disorder, single episode, unspecified: Secondary | ICD-10-CM | POA: Diagnosis not present

## 2023-01-05 DIAGNOSIS — E7800 Pure hypercholesterolemia, unspecified: Secondary | ICD-10-CM | POA: Diagnosis not present

## 2023-01-26 ENCOUNTER — Other Ambulatory Visit (HOSPITAL_BASED_OUTPATIENT_CLINIC_OR_DEPARTMENT_OTHER): Payer: Self-pay

## 2023-01-26 MED ORDER — SEMAGLUTIDE-WEIGHT MANAGEMENT 1.7 MG/0.75ML ~~LOC~~ SOAJ
1.7000 mg | SUBCUTANEOUS | 0 refills | Status: AC
  Filled 2023-01-26: qty 9, 84d supply, fill #0
  Filled 2023-04-06: qty 3, 28d supply, fill #0

## 2023-02-02 ENCOUNTER — Other Ambulatory Visit (HOSPITAL_BASED_OUTPATIENT_CLINIC_OR_DEPARTMENT_OTHER): Payer: Self-pay

## 2023-02-07 ENCOUNTER — Other Ambulatory Visit (HOSPITAL_BASED_OUTPATIENT_CLINIC_OR_DEPARTMENT_OTHER): Payer: Self-pay

## 2023-02-10 ENCOUNTER — Other Ambulatory Visit (HOSPITAL_BASED_OUTPATIENT_CLINIC_OR_DEPARTMENT_OTHER): Payer: Self-pay

## 2023-02-12 ENCOUNTER — Other Ambulatory Visit (HOSPITAL_BASED_OUTPATIENT_CLINIC_OR_DEPARTMENT_OTHER): Payer: Self-pay

## 2023-04-06 ENCOUNTER — Other Ambulatory Visit (HOSPITAL_BASED_OUTPATIENT_CLINIC_OR_DEPARTMENT_OTHER): Payer: Self-pay

## 2023-04-15 ENCOUNTER — Emergency Department (HOSPITAL_BASED_OUTPATIENT_CLINIC_OR_DEPARTMENT_OTHER)
Admission: EM | Admit: 2023-04-15 | Discharge: 2023-04-16 | Discharge status: Against Medical Advice | Payer: BC Managed Care – PPO | Attending: Emergency Medicine | Admitting: Emergency Medicine

## 2023-04-15 ENCOUNTER — Other Ambulatory Visit: Payer: Self-pay

## 2023-04-15 ENCOUNTER — Emergency Department (HOSPITAL_BASED_OUTPATIENT_CLINIC_OR_DEPARTMENT_OTHER): Payer: BC Managed Care – PPO

## 2023-04-15 DIAGNOSIS — R918 Other nonspecific abnormal finding of lung field: Secondary | ICD-10-CM | POA: Diagnosis not present

## 2023-04-15 DIAGNOSIS — R0789 Other chest pain: Secondary | ICD-10-CM | POA: Diagnosis not present

## 2023-04-15 DIAGNOSIS — R0602 Shortness of breath: Secondary | ICD-10-CM | POA: Diagnosis not present

## 2023-04-15 DIAGNOSIS — J029 Acute pharyngitis, unspecified: Secondary | ICD-10-CM | POA: Diagnosis not present

## 2023-04-15 DIAGNOSIS — R079 Chest pain, unspecified: Secondary | ICD-10-CM | POA: Diagnosis not present

## 2023-04-15 DIAGNOSIS — J90 Pleural effusion, not elsewhere classified: Secondary | ICD-10-CM | POA: Diagnosis not present

## 2023-04-15 DIAGNOSIS — J45909 Unspecified asthma, uncomplicated: Secondary | ICD-10-CM | POA: Diagnosis not present

## 2023-04-15 DIAGNOSIS — I10 Essential (primary) hypertension: Secondary | ICD-10-CM | POA: Diagnosis not present

## 2023-04-15 DIAGNOSIS — Z20822 Contact with and (suspected) exposure to covid-19: Secondary | ICD-10-CM | POA: Insufficient documentation

## 2023-04-15 DIAGNOSIS — Z79899 Other long term (current) drug therapy: Secondary | ICD-10-CM | POA: Insufficient documentation

## 2023-04-15 LAB — RESP PANEL BY RT-PCR (RSV, FLU A&B, COVID)  RVPGX2
Influenza A by PCR: NEGATIVE
Influenza B by PCR: NEGATIVE
Resp Syncytial Virus by PCR: NEGATIVE
SARS Coronavirus 2 by RT PCR: NEGATIVE

## 2023-04-15 LAB — CBC
HCT: 37.6 % (ref 36.0–46.0)
Hemoglobin: 12.9 g/dL (ref 12.0–15.0)
MCH: 28.5 pg (ref 26.0–34.0)
MCHC: 34.3 g/dL (ref 30.0–36.0)
MCV: 83.2 fL (ref 80.0–100.0)
Platelets: 380 10*3/uL (ref 150–400)
RBC: 4.52 MIL/uL (ref 3.87–5.11)
RDW: 13.6 % (ref 11.5–15.5)
WBC: 8.1 10*3/uL (ref 4.0–10.5)
nRBC: 0 % (ref 0.0–0.2)

## 2023-04-15 LAB — TROPONIN I (HIGH SENSITIVITY): Troponin I (High Sensitivity): <2 ng/L (ref <18)

## 2023-04-15 LAB — BASIC METABOLIC PANEL
Anion gap: 11 (ref 5–15)
BUN: 9 mg/dL (ref 6–20)
CO2: 23 mmol/L (ref 22–32)
Calcium: 10.1 mg/dL (ref 8.9–10.3)
Chloride: 100 mmol/L (ref 98–111)
Creatinine, Ser: 0.86 mg/dL (ref 0.44–1.00)
GFR, Estimated: >60 mL/min (ref >60)
Glucose, Bld: 95 mg/dL (ref 70–99)
Potassium: 3 mmol/L — ABNORMAL LOW (ref 3.5–5.1)
Sodium: 134 mmol/L — ABNORMAL LOW (ref 135–145)

## 2023-04-15 LAB — PREGNANCY, URINE: Preg Test, Ur: NEGATIVE

## 2023-04-15 MED ORDER — KETOROLAC TROMETHAMINE 30 MG/ML IJ SOLN
30.0000 mg | Freq: Once | INTRAMUSCULAR | Status: AC
  Administered 2023-04-15: 30 mg via INTRAMUSCULAR
  Filled 2023-04-15: qty 1

## 2023-04-15 NOTE — ED Triage Notes (Signed)
Sore throat, chest pain, back pain. Some sob with chest pain Started Friday.  Took goody powder with some relief

## 2023-04-16 LAB — TROPONIN I (HIGH SENSITIVITY): Troponin I (High Sensitivity): <2 ng/L (ref <18)

## 2023-04-16 MED ORDER — AMOXICILLIN 500 MG PO CAPS: 1000.0000 mg | ORAL_CAPSULE | Freq: Three times a day (TID) | ORAL | 0 refills | Status: AC

## 2023-04-16 MED ORDER — AMOXICILLIN 500 MG PO CAPS
500.0000 mg | ORAL_CAPSULE | Freq: Once | ORAL | Status: AC
  Administered 2023-04-16: 500 mg via ORAL
  Filled 2023-04-16: qty 1

## 2023-04-16 NOTE — ED Provider Notes (Incomplete)
[place] EMERGENCY DEPARTMENT AT [facility] Provider Note   CSN: [id number] Arrival date & time: 04/15/23  2037     History {Add pertinent medical, surgical, social history, OB history to HPI:1} Chief Complaint  Patient presents with  . Sore Throat  . Chest Pain    Faith Wells is a 47 y.o. female.  HPI     This is a 47 year old female who presents with multiple complaints.  Patient reports upper respiratory symptoms including chest pain, back pain, sore throat.  She also developed some shortness of breath.  Symptoms started on Friday.  She feels like the left side of her neck has swollen but has improved over the last several days.  She took [place] powder with some relief.  No fevers.  Cough that has not been productive.  Home Medications Prior to Admission medications   Medication Sig Start Date End Date Taking? Authorizing Provider  amoxicillin (AMOXIL) 500 MG capsule Take 2 capsules (1,000 mg total) by mouth 3 (three) times daily. 04/16/23  Yes Mallori Araque, Mayer Masker, MD  amLODipine (NORVASC) 2.5 MG tablet Take 1 tablet (2.5 mg total) by mouth daily. For high blood pressure 06/21/18   Nwoko, Nicole Kindred I, NP  clotrimazole (LOTRIMIN) 1 % cream Apply topically 2 (two) times daily. For fungal rashes 06/20/18   Armandina Stammer I, NP  QUEtiapine (SEROQUEL) 300 MG tablet Take 1 tablet (300 mg total) by mouth at bedtime. For mood control 06/20/18   Armandina Stammer I, NP  Semaglutide-Weight Management 1 MG/0.5ML SOAJ Inject 1 mg into the skin once a week. 08/05/22   Renaye Rakers, MD  Semaglutide-Weight Management 1.7 MG/0.75ML SOAJ Inject 1.7 mg into the skin once a week. 10/06/22     Semaglutide-Weight Management 1.7 MG/0.75ML SOAJ Inject 1.7 mg into the skin once a week. 01/26/23   Renaye Rakers, MD  traZODone (DESYREL) 50 MG tablet Take 1 tablet (50 mg total) by mouth at bedtime as needed for sleep. 06/20/18   Armandina Stammer I, NP  valsartan-hydrochlorothiazide (DIOVAN-HCT) 160-12.5 MG tablet  Take 1 tablet by mouth daily. For high blood pressure 06/20/18   Armandina Stammer I, NP  venlafaxine XR (EFFEXOR-XR) 150 MG 24 hr capsule Take 1 capsule (150 mg total) by mouth at bedtime. For depression 06/20/18   Sanjuana Kava, NP      Allergies    Patient has no known allergies.    Review of Systems   Review of Systems  Constitutional:  Negative for fever.  All other systems reviewed and are negative.   Physical Exam Updated Vital Signs BP 138/80   Pulse 85   Temp 98.2 F (36.8 C) (Oral)   Resp 18   LMP 04/14/2023   SpO2 100%  Physical Exam  ED Results / Procedures / Treatments   Labs (all labs ordered are listed, but only abnormal results are displayed) Labs Reviewed  BASIC METABOLIC PANEL - Abnormal; Notable for the following components:      Result Value   Sodium 134 (*)    Potassium 3.0 (*)    All other components within normal limits  RESP PANEL BY RT-PCR (RSV, FLU A&B, COVID)  RVPGX2  CBC  PREGNANCY, URINE  TROPONIN I (HIGH SENSITIVITY)  TROPONIN I (HIGH SENSITIVITY)    EKG EKG Interpretation Date/Time:  Sunday April 15 2023 20:52:44 EDT Ventricular Rate:  88 PR Interval:  142 QRS Duration:  76 QT Interval:  316 QTC Calculation: 382 R Axis:   61  Text Interpretation:  Normal sinus rhythm T wave abnormality, consider inferior ischemia Abnormal ECG When compared with ECG of 12-Jun-2018 16:45, Non-specific change in ST segment in Inferior leads T wave inversion now evident in Inferior leads Confirmed by Ross Marcus ([id number]) on 04/15/2023 11:34:23 PM  Radiology DG Chest Port 1 View  Result Date: 04/15/2023 CLINICAL DATA:  Chest pain, sore throat, shortness of breath EXAM: PORTABLE CHEST 1 VIEW COMPARISON:  06/11/2018 FINDINGS: Heart and mediastinal contours within normal limits. No confluent airspace opacities or effusions. Blunting of the left costophrenic angle could reflect small left pleural effusion. No acute bony abnormality. IMPRESSION: Suspect  small left pleural effusion. Electronically Signed   By: Charlett Nose M.D.   On: 04/15/2023 21:53    Procedures Procedures  {Document cardiac monitor, telemetry assessment procedure when appropriate:1}  Medications Ordered in ED Medications  ketorolac (TORADOL) 30 MG/ML injection 30 mg (30 mg Intramuscular Given 04/15/23 2359)    ED Course/ Medical Decision Making/ A&P   {   Click here for ABCD2, HEART and other calculatorsREFRESH Note before signing :1}                              Medical Decision Making Amount and/or Complexity of Data Reviewed Labs: ordered.  Risk Prescription drug management.   ***  {Document critical care time when appropriate:1} {Document review of labs and clinical decision tools ie heart score, Chads2Vasc2 etc:1}  {Document your independent review of radiology images, and any outside records:1} {Document your discussion with family members, caretakers, and with consultants:1} {Document social determinants of health affecting pt's care:1} {Document your decision making why or why not admission, treatments were needed:1} Final Clinical Impression(s) / ED Diagnoses Final diagnoses:  Pleural effusion    Rx / DC Orders ED Discharge Orders          Ordered    amoxicillin (AMOXIL) 500 MG capsule  3 times daily        04/16/23 0043

## 2023-04-16 NOTE — Discharge Instructions (Addendum)
You were seen today for multiple complaints.  You may have an early pneumonia on your chest x-ray.  Your COVID and influenza testing are negative.  Make sure that you are staying hydrated.  Take Tylenol or ibuprofen for any body aches or pains.,  Regarding your chest pain, your heart testing is negative.

## 2023-04-16 NOTE — ED Provider Notes (Signed)
[facility] Provider Note   CSN: [id number] Arrival date & time: 04/15/23  2037     History {Add pertinent medical, surgical, social history, OB history to HPI:1} Chief Complaint  Patient presents with   Sore Throat   Chest Pain    Faith Wells is a 47 y.o. female.  HPI     Home Medications Prior to Admission medications   Medication Sig Start Date End Date Taking? Authorizing Provider  amoxicillin (AMOXIL) 500 MG capsule Take 2 capsules (1,000 mg total) by mouth 3 (three) times daily. 04/16/23  Yes Johnae Friley, Mayer Masker, MD  amLODipine (NORVASC) 2.5 MG tablet Take 1 tablet (2.5 mg total) by mouth daily. For high blood pressure 06/21/18   Nwoko, Nicole Kindred I, NP  clotrimazole (LOTRIMIN) 1 % cream Apply topically 2 (two) times daily. For fungal rashes 06/20/18   Armandina Stammer I, NP  QUEtiapine (SEROQUEL) 300 MG tablet Take 1 tablet (300 mg total) by mouth at bedtime. For mood control 06/20/18   Armandina Stammer I, NP  Semaglutide-Weight Management 1 MG/0.5ML SOAJ Inject 1 mg into the skin once a week. 08/05/22   Renaye Rakers, MD  Semaglutide-Weight Management 1.7 MG/0.75ML SOAJ Inject 1.7 mg into the skin once a week. 10/06/22     Semaglutide-Weight Management 1.7 MG/0.75ML SOAJ Inject 1.7 mg into the skin once a week. 01/26/23   Renaye Rakers, MD  traZODone (DESYREL) 50 MG tablet Take 1 tablet (50 mg total) by mouth at bedtime as needed for sleep. 06/20/18   Armandina Stammer I, NP  valsartan-hydrochlorothiazide (DIOVAN-HCT) 160-12.5 MG tablet Take 1 tablet by mouth daily. For high blood pressure 06/20/18   Armandina Stammer I, NP  venlafaxine XR (EFFEXOR-XR) 150 MG 24 hr capsule Take 1 capsule (150 mg total) by mouth at bedtime. For depression 06/20/18   Sanjuana Kava, NP      Allergies    Patient has no known allergies.    Review of Systems   Review of Systems  Physical Exam Updated Vital Signs BP 138/80   Pulse 85   Temp 98.2 F (36.8 C) (Oral)    Resp 18   LMP 04/14/2023   SpO2 100%  Physical Exam  ED Results / Procedures / Treatments   Labs (all labs ordered are listed, but only abnormal results are displayed) Labs Reviewed  BASIC METABOLIC PANEL - Abnormal; Notable for the following components:      Result Value   Sodium 134 (*)    Potassium 3.0 (*)    All other components within normal limits  RESP PANEL BY RT-PCR (RSV, FLU A&B, COVID)  RVPGX2  CBC  PREGNANCY, URINE  TROPONIN I (HIGH SENSITIVITY)  TROPONIN I (HIGH SENSITIVITY)    EKG EKG Interpretation Date/Time:  Sunday April 15 2023 20:52:44 EDT Ventricular Rate:  88 PR Interval:  142 QRS Duration:  76 QT Interval:  316 QTC Calculation: 382 R Axis:   61  Text Interpretation: Normal sinus rhythm T wave abnormality, consider inferior ischemia Abnormal ECG When compared with ECG of 12-Jun-2018 16:45, Non-specific change in ST segment in Inferior leads T wave inversion now evident in Inferior leads Confirmed by Ross Marcus ([id number]) on 04/15/2023 11:34:23 PM  Radiology DG Chest Port 1 View  Result Date: 04/15/2023 CLINICAL DATA:  Chest pain, sore throat, shortness of breath EXAM: PORTABLE CHEST 1 VIEW COMPARISON:  06/11/2018 FINDINGS: Heart and mediastinal contours within normal limits. No confluent airspace opacities or effusions. Blunting of the left  costophrenic angle could reflect small left pleural effusion. No acute bony abnormality. IMPRESSION: Suspect small left pleural effusion. Electronically Signed   By: Charlett Nose M.D.   On: 04/15/2023 21:53    Procedures Procedures  {Document cardiac monitor, telemetry assessment procedure when appropriate:1}  Medications Ordered in ED Medications  ketorolac (TORADOL) 30 MG/ML injection 30 mg (30 mg Intramuscular Given 04/15/23 2359)    ED Course/ Medical Decision Making/ A&P   {   Click here for ABCD2, HEART and other calculatorsREFRESH Note before signing :1}                              Medical  Decision Making Amount and/or Complexity of Data Reviewed Labs: ordered.  Risk Prescription drug management.   ***  {Document critical care time when appropriate:1} {Document review of labs and clinical decision tools ie heart score, Chads2Vasc2 etc:1}  {Document your independent review of radiology images, and any outside records:1} {Document your discussion with family members, caretakers, and with consultants:1} {Document social determinants of health affecting pt's care:1} {Document your decision making why or why not admission, treatments were needed:1} Final Clinical Impression(s) / ED Diagnoses Final diagnoses:  Pleural effusion    Rx / DC Orders ED Discharge Orders          Ordered    amoxicillin (AMOXIL) 500 MG capsule  3 times daily        04/16/23 0043

## 2023-04-19 DIAGNOSIS — F900 Attention-deficit hyperactivity disorder, predominantly inattentive type: Secondary | ICD-10-CM | POA: Diagnosis not present

## 2023-04-19 DIAGNOSIS — F329 Major depressive disorder, single episode, unspecified: Secondary | ICD-10-CM | POA: Diagnosis not present

## 2023-04-19 DIAGNOSIS — I10 Essential (primary) hypertension: Secondary | ICD-10-CM | POA: Diagnosis not present

## 2023-04-19 DIAGNOSIS — J90 Pleural effusion, not elsewhere classified: Secondary | ICD-10-CM | POA: Diagnosis not present

## 2023-04-19 DIAGNOSIS — E785 Hyperlipidemia, unspecified: Secondary | ICD-10-CM | POA: Diagnosis not present

## 2023-04-19 DIAGNOSIS — E559 Vitamin D deficiency, unspecified: Secondary | ICD-10-CM | POA: Diagnosis not present

## 2023-04-19 DIAGNOSIS — E6609 Other obesity due to excess calories: Secondary | ICD-10-CM | POA: Diagnosis not present

## 2023-04-30 DIAGNOSIS — F430 Acute stress reaction: Secondary | ICD-10-CM | POA: Diagnosis not present

## 2023-04-30 DIAGNOSIS — R41840 Attention and concentration deficit: Secondary | ICD-10-CM | POA: Diagnosis not present

## 2023-04-30 DIAGNOSIS — F411 Generalized anxiety disorder: Secondary | ICD-10-CM | POA: Diagnosis not present

## 2023-04-30 DIAGNOSIS — F333 Major depressive disorder, recurrent, severe with psychotic symptoms: Secondary | ICD-10-CM | POA: Diagnosis not present

## 2023-05-25 ENCOUNTER — Other Ambulatory Visit (HOSPITAL_BASED_OUTPATIENT_CLINIC_OR_DEPARTMENT_OTHER): Payer: Self-pay

## 2023-05-25 DIAGNOSIS — E1169 Type 2 diabetes mellitus with other specified complication: Secondary | ICD-10-CM | POA: Diagnosis not present

## 2023-05-25 DIAGNOSIS — I10 Essential (primary) hypertension: Secondary | ICD-10-CM | POA: Diagnosis not present

## 2023-05-25 DIAGNOSIS — E782 Mixed hyperlipidemia: Secondary | ICD-10-CM | POA: Diagnosis not present

## 2023-05-25 MED ORDER — SEMAGLUTIDE-WEIGHT MANAGEMENT 2.4 MG/0.75ML ~~LOC~~ SOAJ
2.4000 mg | SUBCUTANEOUS | 1 refills | Status: AC
  Filled 2023-05-25 – 2023-06-08 (×2): qty 3, 28d supply, fill #0
  Filled 2023-08-03: qty 3, 28d supply, fill #1

## 2023-06-05 ENCOUNTER — Other Ambulatory Visit (HOSPITAL_BASED_OUTPATIENT_CLINIC_OR_DEPARTMENT_OTHER): Payer: Self-pay

## 2023-06-08 ENCOUNTER — Other Ambulatory Visit (HOSPITAL_BASED_OUTPATIENT_CLINIC_OR_DEPARTMENT_OTHER): Payer: Self-pay

## 2023-08-03 ENCOUNTER — Other Ambulatory Visit (HOSPITAL_BASED_OUTPATIENT_CLINIC_OR_DEPARTMENT_OTHER): Payer: Self-pay

## 2023-08-22 DIAGNOSIS — F430 Acute stress reaction: Secondary | ICD-10-CM | POA: Diagnosis not present

## 2023-08-22 DIAGNOSIS — F333 Major depressive disorder, recurrent, severe with psychotic symptoms: Secondary | ICD-10-CM | POA: Diagnosis not present

## 2023-08-22 DIAGNOSIS — F411 Generalized anxiety disorder: Secondary | ICD-10-CM | POA: Diagnosis not present

## 2023-12-04 DIAGNOSIS — Z6833 Body mass index (BMI) 33.0-33.9, adult: Secondary | ICD-10-CM | POA: Diagnosis not present

## 2023-12-04 DIAGNOSIS — I10 Essential (primary) hypertension: Secondary | ICD-10-CM | POA: Diagnosis not present

## 2023-12-04 DIAGNOSIS — E785 Hyperlipidemia, unspecified: Secondary | ICD-10-CM | POA: Diagnosis not present

## 2023-12-04 DIAGNOSIS — R7303 Prediabetes: Secondary | ICD-10-CM | POA: Diagnosis not present

## 2024-03-04 DIAGNOSIS — E782 Mixed hyperlipidemia: Secondary | ICD-10-CM | POA: Diagnosis not present

## 2024-03-04 DIAGNOSIS — I10 Essential (primary) hypertension: Secondary | ICD-10-CM | POA: Diagnosis not present

## 2024-03-04 DIAGNOSIS — E6609 Other obesity due to excess calories: Secondary | ICD-10-CM | POA: Diagnosis not present

## 2024-03-10 DIAGNOSIS — F411 Generalized anxiety disorder: Secondary | ICD-10-CM | POA: Diagnosis not present

## 2024-03-10 DIAGNOSIS — F333 Major depressive disorder, recurrent, severe with psychotic symptoms: Secondary | ICD-10-CM | POA: Diagnosis not present

## 2024-03-10 DIAGNOSIS — F430 Acute stress reaction: Secondary | ICD-10-CM | POA: Diagnosis not present

## 2024-03-10 DIAGNOSIS — R41840 Attention and concentration deficit: Secondary | ICD-10-CM | POA: Diagnosis not present

## 2024-06-25 DIAGNOSIS — F411 Generalized anxiety disorder: Secondary | ICD-10-CM | POA: Diagnosis not present

## 2024-06-25 DIAGNOSIS — F900 Attention-deficit hyperactivity disorder, predominantly inattentive type: Secondary | ICD-10-CM | POA: Diagnosis not present

## 2024-06-25 DIAGNOSIS — F333 Major depressive disorder, recurrent, severe with psychotic symptoms: Secondary | ICD-10-CM | POA: Diagnosis not present

## 2024-06-25 DIAGNOSIS — F430 Acute stress reaction: Secondary | ICD-10-CM | POA: Diagnosis not present
